# Patient Record
Sex: Male | Born: 1990 | Race: Black or African American | Hispanic: No | Marital: Married | Smoking: Never smoker
Health system: Southern US, Community
[De-identification: ages and names within clinical notes are randomized; demographics above are authoritative.]

## PROBLEM LIST (undated history)

## (undated) DIAGNOSIS — R519 Headache, unspecified: Secondary | ICD-10-CM

## (undated) DIAGNOSIS — T7840XA Allergy, unspecified, initial encounter: Secondary | ICD-10-CM

## (undated) HISTORY — DX: Headache, unspecified: R51.9

## (undated) HISTORY — DX: Allergy, unspecified, initial encounter: T78.40XA

---

## 2015-09-26 ENCOUNTER — Ambulatory Visit (INDEPENDENT_AMBULATORY_CARE_PROVIDER_SITE_OTHER): Payer: BLUE CROSS/BLUE SHIELD

## 2015-09-26 ENCOUNTER — Ambulatory Visit (INDEPENDENT_AMBULATORY_CARE_PROVIDER_SITE_OTHER): Payer: BLUE CROSS/BLUE SHIELD | Admitting: Urgent Care

## 2015-09-26 VITALS — BP 122/82 | HR 74 | Temp 98.0°F | Resp 16 | Ht 66.5 in | Wt 201.0 lb

## 2015-09-26 DIAGNOSIS — M7989 Other specified soft tissue disorders: Secondary | ICD-10-CM | POA: Diagnosis not present

## 2015-09-26 DIAGNOSIS — M79644 Pain in right finger(s): Secondary | ICD-10-CM | POA: Diagnosis not present

## 2015-09-26 LAB — POCT CBC
Granulocyte percent: 73.9 %G (ref 37–80)
HCT, POC: 45 % (ref 43.5–53.7)
HEMOGLOBIN: 15.9 g/dL (ref 14.1–18.1)
LYMPH, POC: 1.7 (ref 0.6–3.4)
MCH, POC: 29.5 pg (ref 27–31.2)
MCHC: 35.3 g/dL (ref 31.8–35.4)
MCV: 83.4 fL (ref 80–97)
MID (cbc): 0.3 (ref 0–0.9)
MPV: 7.9 fL (ref 0–99.8)
PLATELET COUNT, POC: 230 10*3/uL (ref 142–424)
POC Granulocyte: 5.8 (ref 2–6.9)
POC LYMPH PERCENT: 22 %L (ref 10–50)
POC MID %: 4.1 % (ref 0–12)
RBC: 5.4 M/uL (ref 4.69–6.13)
RDW, POC: 13.5 %
WBC: 7.8 10*3/uL (ref 4.6–10.2)

## 2015-09-26 LAB — URIC ACID: URIC ACID, SERUM: 5.3 mg/dL (ref 4.0–7.8)

## 2015-09-26 MED ORDER — PREDNISONE 20 MG PO TABS: ORAL_TABLET | ORAL | Status: DC

## 2015-09-26 NOTE — Progress Notes (Signed)
    MRN: [id number] DOB: 29-Sep-1990  Subjective:   Brandon Suarez is a 25 y.o. male presenting for chief complaint of Hand Problem  Reports 2 day history of right thumb swelling, worsening pain. Pain is constant, achy with intermittent sharp pains, aggravated with movement and use of thumb. Has tried ibuprofen with minimal relief. Has also tried icing without relief. Patient is right handed, works for [employer], uses his hand regularly. Denies trauma, tingling, history of bony arthropathy, history of fractures.   Brandon Suarez currently has no medications in their medication list. Also has No Known Allergies.  Brandon Suarez  has a past medical history of Allergy. Also  has no past surgical history on file.  Objective:   Vitals: BP 122/82 mmHg  Pulse 74  Temp(Src) 98 F (36.7 C) (Oral)  Resp 16  Ht 5' 6.5" (1.689 m)  Wt 201 lb (91.173 kg)  BMI 31.96 kg/m2  SpO2 98%  Physical Exam  Constitutional: He is oriented to person, place, and time. He appears well-developed and well-nourished.  Cardiovascular: Normal rate.   Pulmonary/Chest: Effort normal.  Musculoskeletal:       Right hand: He exhibits decreased range of motion (of right thumb in all ROM), bony tenderness (over 1st MCP and areas depicted) and swelling (over area depicted). He exhibits normal capillary refill, no deformity and no laceration. Normal sensation noted. Decreased strength (secondary to pain) noted.       Hands: Neurological: He is alert and oriented to person, place, and time.  Skin: Skin is warm and dry.   No results found.   Results for orders placed or performed in visit on 09/26/15 (from the past 24 hour(s))  POCT CBC     Status: None   Collection Time: 09/26/15 11:05 AM  Result Value Ref Range   WBC 7.8 4.6 - 10.2 K/uL   Lymph, poc 1.7 0.6 - 3.4   POC LYMPH PERCENT 22.0 10 - 50 %L   MID (cbc) 0.3 0 - 0.9   POC MID % 4.1 0 - 12 %M   POC Granulocyte 5.8 2 - 6.9   Granulocyte percent 73.9 37 - 80  %G   RBC 5.40 4.69 - 6.13 M/uL   Hemoglobin 15.9 14.1 - 18.1 g/dL   HCT, POC 40.945.0 81.143.5 - 53.7 %   MCV 83.4 80 - 97 fL   MCH, POC 29.5 27 - 31.2 pg   MCHC 35.3 31.8 - 35.4 g/dL   RDW, POC 91.413.5 %   Platelet Count, POC 230 142 - 424 K/uL   MPV 7.9 0 - 99.8 fL   Assessment and Plan :   This case was precepted with Dr. Katrinka BlazingSmith.   1. Swelling of right thumb 2. Thumb pain, right - Reviewed in clinic. There is no traumatic history. Will manage as inflammatory process. Recheck in 2 days if no improvement. Consider referral to hand specialist at that point.  Wallis BambergMario Amato [place]ano, PA-C [facility] [phone] 09/26/2015 10:47 AM

## 2015-09-26 NOTE — Patient Instructions (Addendum)
Gout Gout is an inflammatory arthritis caused by a buildup of uric acid crystals in the joints. Uric acid is a chemical that is normally present in the blood. When the level of uric acid in the blood is too high it can form crystals that deposit in your joints and tissues. This causes joint redness, soreness, and swelling (inflammation). Repeat attacks are common. Over time, uric acid crystals can form into masses (tophi) near a joint, destroying bone and causing disfigurement. Gout is treatable and often preventable. CAUSES  The disease begins with elevated levels of uric acid in the blood. Uric acid is produced by your body when it breaks down a naturally found substance called purines. Certain foods you eat, such as meats and fish, contain high amounts of purines. Causes of an elevated uric acid level include:  Being passed down from parent to child (heredity).  Diseases that cause increased uric acid production (such as obesity, psoriasis, and certain cancers).  Excessive alcohol use.  Diet, especially diets rich in meat and seafood.  Medicines, including certain cancer-fighting medicines (chemotherapy), water pills (diuretics), and aspirin.  Chronic kidney disease. The kidneys are no longer able to remove uric acid well.  Problems with metabolism. Conditions strongly associated with gout include:  Obesity.  High blood pressure.  High cholesterol.  Diabetes. Not everyone with elevated uric acid levels gets gout. It is not understood why some people get gout and others do not. Surgery, joint injury, and eating too much of certain foods are some of the factors that can lead to gout attacks. SYMPTOMS   An attack of gout comes on quickly. It causes intense pain with redness, swelling, and warmth in a joint.  Fever can occur.  Often, only one joint is involved. Certain joints are more commonly involved:  Base of the big toe.  Knee.  Ankle.  Wrist.  Finger. Without  treatment, an attack usually goes away in a few days to weeks. Between attacks, you usually will not have symptoms, which is different from many other forms of arthritis. DIAGNOSIS  Your caregiver will suspect gout based on your symptoms and exam. In some cases, tests may be recommended. The tests may include:  Blood tests.  Urine tests.  X-rays.  Joint fluid exam. This exam requires a needle to remove fluid from the joint (arthrocentesis). Using a microscope, gout is confirmed when uric acid crystals are seen in the joint fluid. TREATMENT  There are two phases to gout treatment: treating the sudden onset (acute) attack and preventing attacks (prophylaxis).  Treatment of an Acute Attack.  Medicines are used. These include anti-inflammatory medicines or steroid medicines.  An injection of steroid medicine into the affected joint is sometimes necessary.  The painful joint is rested. Movement can worsen the arthritis.  You may use warm or cold treatments on painful joints, depending which works best for you.  Treatment to Prevent Attacks.  If you suffer from frequent gout attacks, your caregiver may advise preventive medicine. These medicines are started after the acute attack subsides. These medicines either help your kidneys eliminate uric acid from your body or decrease your uric acid production. You may need to stay on these medicines for a very long time.  The early phase of treatment with preventive medicine can be associated with an increase in acute gout attacks. For this reason, during the first few months of treatment, your caregiver may also advise you to take medicines usually used for acute gout treatment. Be sure you   understand your caregiver's directions. Your caregiver may make several adjustments to your medicine dose before these medicines are effective.  Discuss dietary treatment with your caregiver or dietitian. Alcohol and drinks high in sugar and fructose and foods  such as meat, poultry, and seafood can increase uric acid levels. Your caregiver or dietitian can advise you on drinks and foods that should be limited. HOME CARE INSTRUCTIONS   Do not take aspirin to relieve pain. This raises uric acid levels.  Only take over-the-counter or prescription medicines for pain, discomfort, or fever as directed by your caregiver.  Rest the joint as much as possible. When in bed, keep sheets and blankets off painful areas.  Keep the affected joint raised (elevated).  Apply warm or cold treatments to painful joints. Use of warm or cold treatments depends on which works best for you.  Use crutches if the painful joint is in your leg.  Drink enough fluids to keep your urine clear or pale yellow. This helps your body get rid of uric acid. Limit alcohol, sugary drinks, and fructose drinks.  Follow your dietary instructions. Pay careful attention to the amount of protein you eat. Your daily diet should emphasize fruits, vegetables, whole grains, and fat-free or low-fat milk products. Discuss the use of coffee, vitamin C, and cherries with your caregiver or dietitian. These may be helpful in lowering uric acid levels.  Maintain a healthy body weight. SEEK MEDICAL CARE IF:   You develop diarrhea, vomiting, or any side effects from medicines.  You do not feel better in 24 hours, or you are getting worse. SEEK IMMEDIATE MEDICAL CARE IF:   Your joint becomes suddenly more tender, and you have chills or a fever. MAKE SURE YOU:   Understand these instructions.  Will watch your condition.  Will get help right away if you are not doing well or get worse.   This information is not intended to replace advice given to you by your health care provider. Make sure you discuss any questions you have with your health care provider.   Document Released: 04/18/2000 Document Revised: 05/12/2014 Document Reviewed: 12/03/2011 Elsevier Interactive Patient Education 2016  Elsevier Inc.     IF you received an x-ray today, you will receive an invoice from [facility]. Please contact  Radiology at [phone] with questions or concerns regarding your invoice.   IF you received labwork today, you will receive an invoice from [facility]. Please contact [facility] at [phone] with questions or concerns regarding your invoice.   Our billing staff will not be able to assist you with questions regarding bills from these companies.  You will be contacted with the lab results as soon as they are available. The fastest way to get your results is to activate your My Chart account. Instructions are located on the last page of this paperwork. If you have not heard from us regarding the results in 2 weeks, please contact this office.      

## 2015-09-28 ENCOUNTER — Telehealth: Payer: Self-pay

## 2015-09-28 NOTE — Telephone Encounter (Signed)
Noted  

## 2015-09-28 NOTE — Telephone Encounter (Signed)
Pt came in to ask about his lab results.  Angie gave told him his uric acid levels were normal.  Pt says its doing better, but still painful.  If he is not any better he will call or come back in next Tuesday.

## 2015-10-02 ENCOUNTER — Encounter: Payer: Self-pay | Admitting: *Deleted

## 2015-10-02 ENCOUNTER — Encounter: Payer: Self-pay | Admitting: Urgent Care

## 2016-12-02 DIAGNOSIS — J301 Allergic rhinitis due to pollen: Secondary | ICD-10-CM | POA: Insufficient documentation

## 2017-10-02 ENCOUNTER — Ambulatory Visit (INDEPENDENT_AMBULATORY_CARE_PROVIDER_SITE_OTHER): Admitting: Urgent Care

## 2017-10-02 ENCOUNTER — Encounter: Payer: Self-pay | Admitting: Urgent Care

## 2017-10-02 ENCOUNTER — Ambulatory Visit (INDEPENDENT_AMBULATORY_CARE_PROVIDER_SITE_OTHER)

## 2017-10-02 VITALS — BP 132/75 | HR 69 | Temp 98.5°F | Resp 17 | Ht 66.5 in | Wt 207.0 lb

## 2017-10-02 DIAGNOSIS — M545 Low back pain, unspecified: Secondary | ICD-10-CM

## 2017-10-02 DIAGNOSIS — M79671 Pain in right foot: Secondary | ICD-10-CM

## 2017-10-02 DIAGNOSIS — B001 Herpesviral vesicular dermatitis: Secondary | ICD-10-CM | POA: Diagnosis not present

## 2017-10-02 MED ORDER — CYCLOBENZAPRINE HCL 5 MG PO TABS: 5.0000 mg | ORAL_TABLET | Freq: Three times a day (TID) | ORAL | 1 refills | Status: DC | PRN

## 2017-10-02 MED ORDER — PREDNISONE 10 MG PO TABS: 40.0000 mg | ORAL_TABLET | Freq: Every day | ORAL | 0 refills | Status: DC

## 2017-10-02 MED ORDER — ACYCLOVIR 400 MG PO TABS: ORAL_TABLET | ORAL | 5 refills | Status: DC

## 2017-10-02 NOTE — Progress Notes (Addendum)
    MRN: [id number] DOB: 1991-01-22  Subjective:   Carlus PavlovDesmond Norell is a 27 y.o. male presenting for 2 month history of right foot pain over medial/ball of his foot that radiates into his right great medial toe. Patient is in [employer], [place], had to be on his feet a lot and do a lot of walking. Reports that it would occasionally look red. Denies fever, trauma, swelling, warmth. [place], has been using ibuprofen since then. Also used naproxen with short term relief. Also reports low back pain, stiffness. Has difficulty bending down and laying in certain positions. Denies falls, injury, trauma. Has used medications above. Has strenuous work related activities including a planned physical fitness test coming up. Would like a work note.   [name] has a current medication list which includes the following prescription(s): ibuprofen. Also has No Known Allergies.  [name]  has a past medical history of Allergy. Denies past surgical history. Denies family history of cancer, diabetes, HTN, HL, heart disease, stroke, mental illness.   Objective:   Vitals: BP 132/75   Pulse 69   Temp 98.5 F (36.9 C) (Oral)   Resp 17   Ht 5' 6.5" (1.689 m)   Wt 207 lb (93.9 kg)   SpO2 98%   BMI 32.91 kg/m   Physical Exam  Constitutional: He is oriented to person, place, and time. He appears well-developed and well-nourished.  Cardiovascular: Normal rate.  Pulmonary/Chest: Effort normal.  Musculoskeletal:       Lumbar back: He exhibits decreased range of motion (flexion, extension) and tenderness (over area depicted, mild). He exhibits no bony tenderness, no swelling, no edema, no deformity, no laceration, no pain, no spasm and normal pulse.       Back:       Right foot: There is tenderness (mild over areas depicted). There is normal range of motion, no bony tenderness, no swelling, normal capillary refill, no crepitus, no deformity and no  laceration.       Feet:  Negative SLR.  Neurological: He is alert and oriented to person, place, and time. He displays normal reflexes.   Assessment and Plan :   Acute bilateral low back pain without sciatica - Plan: Uric A+ANA+RA Qn+CRP+ASO, DG Lumbar Spine Complete  Right foot pain - Plan: Uric A+ANA+RA Qn+CRP+ASO  Labs pending will use prednisone course given that patient has failed treatment with naproxen and ibuprofen at high doses.  Radiology report pending and labs pending.  Wrote for a work note for [employer] only.  Follow-up if symptoms fail to resolve.  Wallis BambergMario Allan Bacigalupi, PA-C 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  I examined him in room 6, reported that he had cold sore over his left lower lip and is requesting medication for this.  On physical exam patient has clusters of erythematous tender lesions over left lower lip.  I counseled that we could use acyclovir to manage this as herpes culture type of infection.  Sent a prescription to his pharmacy electronically.

## 2017-10-02 NOTE — Addendum Note (Signed)
Addended by: Wallis Bamberg on: 10/02/2017 10:55 AM   Modules accepted: Orders, Level of Service

## 2017-10-02 NOTE — Patient Instructions (Addendum)
Hydrate well with at least 2 liters (1 gallon) of water daily.     Back Pain, Adult Many adults have back pain from time to time. Common causes of back pain include:  A strained muscle or ligament.  Wear and tear (degeneration) of the spinal disks.  Arthritis.  A hit to the back.  Back pain can be short-lived (acute) or last a long time (chronic). A physical exam, lab tests, and imaging studies may be done to find the cause of your pain. Follow these instructions at home: Managing pain and stiffness  Take over-the-counter and prescription medicines only as told by your health care provider.  If directed, apply heat to the affected area as often as told by your health care provider. Use the heat source that your health care provider recommends, such as a moist heat pack or a heating pad. ? Place a towel between your skin and the heat source. ? Leave the heat on for 20-30 minutes. ? Remove the heat if your skin turns bright red. This is especially important if you are unable to feel pain, heat, or cold. You have a greater risk of getting burned.  If directed, apply ice to the injured area: ? Put ice in a plastic bag. ? Place a towel between your skin and the bag. ? Leave the ice on for 20 minutes, 2-3 times a day for the first 2-3 days. Activity  Do not stay in bed. Resting more than 1-2 days can delay your recovery.  Take short walks on even surfaces as soon as you are able. Try to increase the length of time you walk each day.  Do not sit, drive, or stand in one place for more than 30 minutes at a time. Sitting or standing for long periods of time can put stress on your back.  Use proper lifting techniques. When you bend and lift, use positions that put less stress on your back: ? [place] your knees. ? Keep the load close to your body. ? Avoid twisting.  Exercise regularly as told by your health care provider. Exercising will help your back heal faster. This also helps prevent  back injuries by keeping muscles strong and flexible.  Your health care provider may recommend that you see a physical therapist. This person can help you come up with a safe exercise program. Do any exercises as told by your physical therapist. Lifestyle  Maintain a healthy weight. Extra weight puts stress on your back and makes it difficult to have good posture.  Avoid activities or situations that make you feel anxious or stressed. Learn ways to manage anxiety and stress. One way to manage stress is through exercise. Stress and anxiety increase muscle tension and can make back pain worse. General instructions  Sleep on a firm mattress in a comfortable position. Try lying on your side with your knees slightly bent. If you lie on your back, put a pillow under your knees.  Follow your treatment plan as told by your health care provider. This may include: ? Cognitive or behavioral therapy. ? Acupuncture or massage therapy. ? Meditation or yoga. Contact a health care provider if:  You have pain that is not relieved with rest or medicine.  You have increasing pain going down into your legs or buttocks.  Your pain does not improve in 2 weeks.  You have pain at night.  You lose weight.  You have a fever or chills. Get help right away if:  You develop  new bowel or bladder control problems.  You have unusual weakness or numbness in your arms or legs.  You develop nausea or vomiting.  You develop abdominal pain.  You feel faint. Summary  Many adults have back pain from time to time. A physical exam, lab tests, and imaging studies may be done to find the cause of your pain.  Use proper lifting techniques. When you bend and lift, use positions that put less stress on your back.  Take over-the-counter and prescription medicines and apply heat or ice as directed by your health care provider. This information is not intended to replace advice given to you by your health care  provider. Make sure you discuss any questions you have with your health care provider. Document Released: 04/21/2005 Document Revised: 05/26/2016 Document Reviewed: 05/26/2016 Elsevier Interactive Patient Education  2018 [employer]    IF you received an x-ray today, you will receive an invoice from [facility]. Please contact [facility] at [phone] with questions or concerns regarding your invoice.   IF you received labwork today, you will receive an invoice from [facility]. Please contact [facility] at [phone] with questions or concerns regarding your invoice.   Our billing staff will not be able to assist you with questions regarding bills from these companies.  You will be contacted with the lab results as soon as they are available. The fastest way to get your results is to activate your My Chart account. Instructions are located on the last page of this paperwork. If you have not heard from us regarding the results in 2 weeks, please contact this office.

## 2017-10-03 LAB — URIC A+ANA+RA QN+CRP+ASO
ASO: 33 [IU]/mL (ref 0.0–200.0)
Anti Nuclear Antibody(ANA): NEGATIVE
CRP: 1.7 mg/L (ref 0.0–4.9)
URIC ACID: 5.1 mg/dL (ref 3.7–8.6)

## 2017-10-05 ENCOUNTER — Encounter: Payer: Self-pay | Admitting: *Deleted

## 2017-10-06 ENCOUNTER — Ambulatory Visit: Payer: Self-pay | Admitting: Podiatry

## 2017-10-06 ENCOUNTER — Other Ambulatory Visit: Payer: Self-pay

## 2017-12-12 ENCOUNTER — Other Ambulatory Visit: Payer: Self-pay

## 2017-12-12 ENCOUNTER — Encounter: Payer: Self-pay | Admitting: Emergency Medicine

## 2017-12-12 ENCOUNTER — Ambulatory Visit (INDEPENDENT_AMBULATORY_CARE_PROVIDER_SITE_OTHER): Admitting: Emergency Medicine

## 2017-12-12 VITALS — BP 115/67 | HR 75 | Temp 98.6°F | Resp 16 | Ht 65.25 in | Wt 194.2 lb

## 2017-12-12 DIAGNOSIS — M62838 Other muscle spasm: Secondary | ICD-10-CM | POA: Diagnosis not present

## 2017-12-12 DIAGNOSIS — M542 Cervicalgia: Secondary | ICD-10-CM

## 2017-12-12 MED ORDER — DICLOFENAC SODIUM 75 MG PO TBEC: 75.0000 mg | DELAYED_RELEASE_TABLET | Freq: Two times a day (BID) | ORAL | 0 refills | Status: AC

## 2017-12-12 MED ORDER — CYCLOBENZAPRINE HCL 5 MG PO TABS: 5.0000 mg | ORAL_TABLET | Freq: Three times a day (TID) | ORAL | 0 refills | Status: DC | PRN

## 2017-12-12 NOTE — Patient Instructions (Addendum)
   IF you received an x-ray today, you will receive an invoice from [facility]. Please contact [facility] at [phone] with questions or concerns regarding your invoice.   IF you received labwork today, you will receive an invoice from [facility]. Please contact [facility] at [phone] with questions or concerns regarding your invoice.   Our billing staff will not be able to assist you with questions regarding bills from these companies.  You will be contacted with the lab results as soon as they are available. The fastest way to get your results is to activate your My Chart account. Instructions are located on the last page of this paperwork. If you have not heard from us regarding the results in 2 weeks, please contact this office.      Cervical Sprain A cervical sprain is a stretch or tear in the tissues that connect bones (ligaments) in the neck. Most neck (cervical) sprains get better in 4-6 weeks. Follow these instructions at home: If you have a neck collar:  Wear it as told by your doctor. Do not take off (do not remove) the collar unless your doctor says that this is safe.  Ask your doctor before adjusting your collar.  If you have long hair, keep it outside of the collar.  Ask your doctor if you may take off the collar for cleaning and bathing. If you may take off the collar: ? Follow instructions from your doctor about how to take off the collar safely. ? Clean the collar by wiping it with mild soap and water. Let it air-dry all the way. ? If your collar has removable pads:  Take the pads out every 1-2 days.  Hand wash the pads with soap and water.  Let the pads air-dry all the way before you put them back in the collar. Do not dry them in a clothes dryer. Do not dry them with a hair dryer. ? Check your skin under the collar for irritation or sores. If you see any, tell your doctor. Managing pain, stiffness, and swelling  Use a cervical traction  device, if told by your doctor.  If told, put heat on the affected area. Do this before exercises (physical therapy) or as often as told by your doctor. Use the heat source that your doctor recommends, such as a moist heat pack or a heating pad. ? Place a towel between your skin and the heat source. ? Leave the heat on for 20-30 minutes. ? Take the heat off (remove the heat) if your skin turns bright red. This is very important if you cannot feel pain, heat, or cold. You may have a greater risk of getting burned.  Put ice on the affected area. ? Put ice in a plastic bag. ? Place a towel between your skin and the bag. ? Leave the ice on for 20 minutes, 2-3 times a day. Activity  Do not drive while wearing a neck collar. If you do not have a neck collar, ask your doctor if it is safe to drive.  Do not drive or use heavy machinery while taking prescription pain medicine or muscle relaxants, unless your doctor approves.  Do not lift anything that is heavier than 10 lb (4.5 kg) until your doctor tells you that it is safe.  Rest as told by your doctor.  Avoid activities that make you feel worse. Ask your doctor what activities are safe for you.  Do exercises as told by your doctor or physical   therapist. Preventing neck sprain  Practice good posture. Adjust your workstation to help with this, if needed.  Exercise regularly as told by your doctor or physical therapist.  Avoid activities that are risky or may cause a neck sprain (cervical sprain). General instructions  Take over-the-counter and prescription medicines only as told by your doctor.  Do not use any products that contain nicotine or tobacco. This includes cigarettes and e-cigarettes. If you need help quitting, ask your doctor.  Keep all follow-up visits as told by your doctor. This is important. Contact a doctor if:  You have pain or other symptoms that get worse.  You have symptoms that do not get better after 2  weeks.  You have pain that does not get better with medicine.  You start to have new, unexplained symptoms.  You have sores or irritated skin from wearing your neck collar. Get help right away if:  You have very bad pain.  You have any of the following in any part of your body: ? Loss of feeling (numbness). ? Tingling. ? Weakness.  You cannot move a part of your body (you have paralysis).  Your activity level does not improve. Summary  A cervical sprain is a stretch or tear in the tissues that connect bones (ligaments) in the neck.  If you have a neck (cervical) collar, do not take off the collar unless your doctor says that this is safe.  Put ice on affected areas as told by your doctor.  Put heat on affected areas as told by your doctor.  Good posture and regular exercise can help prevent a neck sprain from happening again. This information is not intended to replace advice given to you by your health care provider. Make sure you discuss any questions you have with your health care provider. Document Released: 10/08/2007 Document Revised: 01/01/2016 Document Reviewed: 01/01/2016 Elsevier Interactive Patient Education  2017 Elsevier Inc.  

## 2017-12-12 NOTE — Progress Notes (Signed)
Brandon Suarez 27 y.o.   Chief Complaint  Patient presents with  . Back Pain    per patient come and go  . Neck Pain    x 1 week per patient back of the nec to sides    HISTORY OF PRESENT ILLNESS: This is a 27 y.o. male complaining of neck pain for 1 week.  It will started a week ago with a sore throat, sinus congestion, headaches, and neck pain.  Infectious symptoms gone but neck pain persists.  Denies injury.  No fever or chills.  No other significant symptoms.  Pain is sharp and constant, worse with turning head, and better with rest.  HPI   Prior to Admission medications   Medication Sig Start Date End Date Taking? Authorizing Provider  acyclovir (ZOVIRAX) 400 MG tablet Take 1 tablet 3 times daily for 5 to 10 days for cold sore flare ups. 10/02/17  Yes Wallis Bamberg, PA-C  cetirizine (ZYRTEC) 10 MG tablet Take by mouth.   Yes [provider]  cyclobenzaprine (FLEXERIL) 5 MG tablet Take 1 tablet (5 mg total) by mouth 3 (three) times daily as needed. Patient not taking: Reported on 12/12/2017 10/02/17   Wallis Bamberg, PA-C  meloxicam [facility]) 15 MG tablet Take by mouth. 08/25/17 08/25/18  [provider]  predniSONE (DELTASONE) 10 MG tablet Take 4 tablets (40 mg total) by mouth daily with breakfast. Patient not taking: Reported on 12/12/2017 10/02/17   Wallis Bamberg, PA-C    No Known Allergies  Patient Active Problem List   Diagnosis Date Noted  . Seasonal allergic rhinitis due to pollen 12/02/2016    Past Medical History:  Diagnosis Date  . Allergy     No past surgical history on file.  Social History   Socioeconomic History  . Marital status: Single    Spouse name: Not on file  . Number of children: Not on file  . Years of education: Not on file  . Highest education level: Not on file  Occupational History  . Not on file  Social Needs  . Financial resource strain: Not on file  . Food insecurity:    Worry: Not on file    Inability: Not on file  .  Transportation needs:    Medical: Not on file    Non-medical: Not on file  Tobacco Use  . Smoking status: Never Smoker  . Smokeless tobacco: Never Used  Substance and Sexual Activity  . Alcohol use: No  . Drug use: Not on file  . Sexual activity: Not on file  Lifestyle  . Physical activity:    Days per week: Not on file    Minutes per session: Not on file  . Stress: Not on file  Relationships  . Social connections:    Talks on phone: Not on file    Gets together: Not on file    Attends religious service: Not on file    Active member of club or organization: Not on file    Attends meetings of clubs or organizations: Not on file    Relationship status: Not on file  . Intimate partner violence:    Fear of current or ex partner: Not on file    Emotionally abused: Not on file    Physically abused: Not on file    Forced sexual activity: Not on file  Other Topics Concern  . Not on file  Social History Narrative  . Not on file    No family history on file.  Review of Systems  Constitutional: Negative.  Negative for chills and fever.  HENT: Positive for congestion and sore throat. Negative for nosebleeds.   Eyes: Negative.  Negative for blurred vision, double vision, discharge and redness.  Respiratory: Negative.  Negative for cough, shortness of breath and wheezing.   Cardiovascular: Negative.  Negative for chest pain and palpitations.  Gastrointestinal: Negative.  Negative for abdominal pain, diarrhea, nausea and vomiting.  Genitourinary: Negative.   Musculoskeletal: Positive for neck pain. Negative for back pain and joint pain.  Skin: Negative.  Negative for rash.  Neurological: Positive for headaches. Negative for dizziness, speech change, focal weakness and weakness.  Endo/Heme/Allergies: Negative.    Vitals:   12/12/17 1104  BP: 115/67  Pulse: 75  Resp: 16  Temp: 98.6 F (37 C)  SpO2: 97%     Physical Exam  Constitutional: He is oriented to person, place,  and time. He appears well-developed and well-nourished.  HENT:  Head: Normocephalic and atraumatic.  Right Ear: External ear normal.  Left Ear: External ear normal.  Nose: Nose normal.  Mouth/Throat: Oropharynx is clear and moist.  Eyes: Pupils are equal, round, and reactive to light. Conjunctivae and EOM are normal.  Neck: Muscular tenderness present. No spinous process tenderness present. No neck rigidity. No edema and no erythema present. No Brudzinski's sign and no Kernig's sign noted.  Cardiovascular: Normal rate, regular rhythm and normal heart sounds.  Pulmonary/Chest: Effort normal and breath sounds normal.  Abdominal: Soft. There is no tenderness.  Musculoskeletal: Normal range of motion. He exhibits no edema or tenderness.  Neurological: He is alert and oriented to person, place, and time. No sensory deficit. He exhibits normal muscle tone. Coordination normal.  Skin: Skin is warm and dry. Capillary refill takes less than 2 seconds. No rash noted.  Psychiatric: He has a normal mood and affect. His behavior is normal.  Vitals reviewed.  A total of 25 minutes was spent in the room with the patient, greater than 50% of which was in counseling/coordination of care regarding differential diagnosis, treatment, medications, and need for follow-up if no better or worse.   ASSESSMENT & PLAN: Brandon Suarez was seen today for back pain and neck pain.  Diagnoses and all orders for this visit:  Neck pain, musculoskeletal -     diclofenac (VOLTAREN) 75 MG EC tablet; Take 1 tablet (75 mg total) by mouth 2 (two) times daily for 5 days. After 5 days take as needed.  Muscle spasm -     cyclobenzaprine (FLEXERIL) 5 MG tablet; Take 1 tablet (5 mg total) by mouth 3 (three) times daily as needed.    Patient Instructions       IF you received an x-ray today, you will receive an invoice from [facility]. Please contact [facility] at [phone] with questions or concerns  regarding your invoice.   IF you received labwork today, you will receive an invoice from [facility]. Please contact [facility] at [phone] with questions or concerns regarding your invoice.   Our billing staff will not be able to assist you with questions regarding bills from these companies.  You will be contacted with the lab results as soon as they are available. The fastest way to get your results is to activate your My Chart account. Instructions are located on the last page of this paperwork. If you have not heard from [place] regarding the results in 2 weeks, please contact this office.     Cervical Sprain A cervical sprain is a  stretch or tear in the tissues that connect bones (ligaments) in the neck. Most neck (cervical) sprains get better in 4-6 weeks. Follow these instructions at home: If you have a neck collar:  Wear it as told by your doctor. Do not take off (do not remove) the collar unless your doctor says that this is safe.  Ask your doctor before adjusting your collar.  If you have long hair, keep it outside of the collar.  Ask your doctor if you may take off the collar for cleaning and bathing. If you may take off the collar: ? Follow instructions from your doctor about how to take off the collar safely. ? Clean the collar by wiping it with mild soap and water. Let it air-dry all the way. ? If your collar has removable pads:  Take the pads out every 1-2 days.  Hand wash the pads with soap and water.  Let the pads air-dry all the way before you put them back in the collar. Do not dry them in a clothes dryer. Do not dry them with a hair dryer. ? Check your skin under the collar for irritation or sores. If you see any, tell your doctor. Managing pain, stiffness, and swelling  Use a cervical traction device, if told by your doctor.  If told, put heat on the affected area. Do this before exercises (physical therapy) or as often as told by your doctor. Use the heat source  that your doctor recommends, such as a moist heat pack or a heating pad. ? Place a towel between your skin and the heat source. ? Leave the heat on for 20-30 minutes. ? Take the heat off (remove the heat) if your skin turns bright red. This is very important if you cannot feel pain, heat, or cold. You may have a greater risk of getting burned.  Put ice on the affected area. ? Put ice in a plastic bag. ? Place a towel between your skin and the bag. ? Leave the ice on for 20 minutes, 2-3 times a day. Activity  Do not drive while wearing a neck collar. If you do not have a neck collar, ask your doctor if it is safe to drive.  Do not drive or use heavy machinery while taking prescription pain medicine or muscle relaxants, unless your doctor approves.  Do not lift anything that is heavier than 10 lb (4.5 kg) until your doctor tells you that it is safe.  Rest as told by your doctor.  Avoid activities that make you feel worse. Ask your doctor what activities are safe for you.  Do exercises as told by your doctor or physical therapist. Preventing neck sprain  Practice good posture. Adjust your workstation to help with this, if needed.  Exercise regularly as told by your doctor or physical therapist.  Avoid activities that are risky or may cause a neck sprain (cervical sprain). General instructions  Take over-the-counter and prescription medicines only as told by your doctor.  Do not use any products that contain nicotine or tobacco. This includes cigarettes and e-cigarettes. If you need help quitting, ask your doctor.  Keep all follow-up visits as told by your doctor. This is important. Contact a doctor if:  You have pain or other symptoms that get worse.  You have symptoms that do not get better after 2 weeks.  You have pain that does not get better with medicine.  You start to have new, unexplained symptoms.  You have sores or  irritated skin from wearing your neck collar. Get  help right away if:  You have very bad pain.  You have any of the following in any part of your body: ? Loss of feeling (numbness). ? Tingling. ? Weakness.  You cannot move a part of your body (you have paralysis).  Your activity level does not improve. Summary  A cervical sprain is a stretch or tear in the tissues that connect bones (ligaments) in the neck.  If you have a neck (cervical) collar, do not take off the collar unless your doctor says that this is safe.  Put ice on affected areas as told by your doctor.  Put heat on affected areas as told by your doctor.  Good posture and regular exercise can help prevent a neck sprain from happening again. This information is not intended to replace advice given to you by your health care provider. Make sure you discuss any questions you have with your health care provider. Document Released: 10/08/2007 Document Revised: 01/01/2016 Document Reviewed: 01/01/2016 Elsevier Interactive Patient Education  2017 Elsevier Inc.      Edwina Barth, MD [facility]

## 2018-03-25 ENCOUNTER — Ambulatory Visit (INDEPENDENT_AMBULATORY_CARE_PROVIDER_SITE_OTHER): Admitting: Emergency Medicine

## 2018-03-25 ENCOUNTER — Encounter: Payer: Self-pay | Admitting: Emergency Medicine

## 2018-03-25 ENCOUNTER — Other Ambulatory Visit: Payer: Self-pay

## 2018-03-25 VITALS — BP 119/71 | HR 71 | Temp 99.0°F | Resp 16 | Ht 65.5 in | Wt 198.0 lb

## 2018-03-25 DIAGNOSIS — M79672 Pain in left foot: Secondary | ICD-10-CM

## 2018-03-25 DIAGNOSIS — G8929 Other chronic pain: Secondary | ICD-10-CM | POA: Insufficient documentation

## 2018-03-25 DIAGNOSIS — M79671 Pain in right foot: Secondary | ICD-10-CM | POA: Diagnosis not present

## 2018-03-25 DIAGNOSIS — B353 Tinea pedis: Secondary | ICD-10-CM | POA: Diagnosis not present

## 2018-03-25 MED ORDER — CLOTRIMAZOLE-BETAMETHASONE 1-0.05 % EX CREA: 1.0000 "application " | TOPICAL_CREAM | Freq: Two times a day (BID) | CUTANEOUS | 3 refills | Status: DC

## 2018-03-25 NOTE — Patient Instructions (Addendum)
     If you have lab work done today you will be contacted with your lab results within the next 2 weeks.  If you have not heard from us then please contact us. The fastest way to get your results is to register for My Chart.   IF you received an x-ray today, you will receive an invoice from [facility]. Please contact [facility] at [phone] with questions or concerns regarding your invoice.   IF you received labwork today, you will receive an invoice from [facility]. Please contact [facility] at [phone] with questions or concerns regarding your invoice.   Our billing staff will not be able to assist you with questions regarding bills from these companies.  You will be contacted with the lab results as soon as they are available. The fastest way to get your results is to activate your My Chart account. Instructions are located on the last page of this paperwork. If you have not heard from us regarding the results in 2 weeks, please contact this office.     Athlete's Foot Athlete's foot (tinea pedis) is a fungal infection of the skin on the feet. It often occurs on the skin that is between or underneath the toes. It can also occur on the soles of the feet. The infection can spread from person to person (is contagious). Follow these instructions at home:  Apply or take over-the-counter and prescription medicines only as told by your doctor.  Keep all follow-up visits as told by your doctor. This is important.  Do not scratch your feet.  Keep your feet dry: ? Wear cotton or wool socks. Change your socks every day or if they become wet. ? Wear shoes that allow air to move around, such as sandals or canvas tennis shoes.  Wash and dry your feet: ? Every day or as told by your doctor. ? After exercising. ? Including the area between your toes.  Wear sandals in wet areas, such as locker rooms and shared showers.  Do not share any of these items: ? Towels. ? Nail  clippers. ? Other personal items that touch your feet.  If you have diabetes, keep your blood sugar under control. Contact a doctor if:  You have a fever.  You have swelling, soreness, warmth, or redness in your foot.  You are not getting better with treatment.  Your symptoms get worse.  You have new symptoms. This information is not intended to replace advice given to you by your health care provider. Make sure you discuss any questions you have with your health care provider. Document Released: 10/08/2007 Document Revised: 09/27/2015 Document Reviewed: 10/23/2014 Elsevier Interactive Patient Education  2018 [employer]

## 2018-03-25 NOTE — Progress Notes (Addendum)
Brandon Suarez 27 y.o.   Chief Complaint  Patient presents with  . Foot Pain    per patient both more so the right x on/off 2 months and peeling    HISTORY OF PRESENT ILLNESS: This is a 27 y.o. male complaining of pain to both feet for the past 2 months.  Denies injury.  Right foot worse.  No other significant symptoms  HPI   Prior to Admission medications   Medication Sig Start Date End Date Taking? Authorizing Provider  acyclovir (ZOVIRAX) 400 MG tablet Take 1 tablet 3 times daily for 5 to 10 days for cold sore flare ups. 10/02/17  Yes Wallis Bamberg, PA-C  cetirizine (ZYRTEC) 10 MG tablet Take by mouth.   Yes [provider]  cyclobenzaprine (FLEXERIL) 5 MG tablet Take 1 tablet (5 mg total) by mouth 3 (three) times daily as needed. Patient not taking: Reported on 03/25/2018 12/12/17   Georgina Quint, MD  meloxicam [facility]) 15 MG tablet Take by mouth. 08/25/17 08/25/18  [provider]  predniSONE (DELTASONE) 10 MG tablet Take 4 tablets (40 mg total) by mouth daily with breakfast. Patient not taking: Reported on 12/12/2017 10/02/17   Wallis Bamberg, PA-C    No Known Allergies  Patient Active Problem List   Diagnosis Date Noted  . Seasonal allergic rhinitis due to pollen 12/02/2016    Past Medical History:  Diagnosis Date  . Allergy     No past surgical history on file.  Social History   Socioeconomic History  . Marital status: Single    Spouse name: Not on file  . Number of children: Not on file  . Years of education: Not on file  . Highest education level: Not on file  Occupational History  . Not on file  Social Needs  . Financial resource strain: Not on file  . Food insecurity:    Worry: Not on file    Inability: Not on file  . Transportation needs:    Medical: Not on file    Non-medical: Not on file  Tobacco Use  . Smoking status: Never Smoker  . Smokeless tobacco: Never Used  Substance and Sexual Activity  . Alcohol use: No  . Drug  use: Not on file  . Sexual activity: Not on file  Lifestyle  . Physical activity:    Days per week: Not on file    Minutes per session: Not on file  . Stress: Not on file  Relationships  . Social connections:    Talks on phone: Not on file    Gets together: Not on file    Attends religious service: Not on file    Active member of club or organization: Not on file    Attends meetings of clubs or organizations: Not on file    Relationship status: Not on file  . Intimate partner violence:    Fear of current or ex partner: Not on file    Emotionally abused: Not on file    Physically abused: Not on file    Forced sexual activity: Not on file  Other Topics Concern  . Not on file  Social History Narrative  . Not on file    No family history on file.   Review of Systems  Constitutional: Negative.  Negative for chills and fever.  HENT: Negative.   Eyes: Negative.   Respiratory: Negative.  Negative for cough and shortness of breath.   Cardiovascular: Negative.  Negative for chest pain and palpitations.  Gastrointestinal: Negative.  Negative for abdominal pain, nausea and vomiting.  Genitourinary: Negative.   Musculoskeletal: Negative.   Skin: Negative.   Neurological: Negative.  Negative for dizziness and headaches.  Endo/Heme/Allergies: Negative.   All other systems reviewed and are negative.   Vitals:   03/25/18 0820  BP: 119/71  Pulse: 71  Resp: 16  Temp: 99 F (37.2 C)  SpO2: 99%    Physical Exam  Constitutional: He is oriented to person, place, and time. He appears well-developed and well-nourished.  HENT:  Head: Normocephalic and atraumatic.  Eyes: Pupils are equal, round, and reactive to light. EOM are normal.  Neck: Normal range of motion. Neck supple.  Cardiovascular: Normal rate and regular rhythm.  Pulmonary/Chest: Effort normal and breath sounds normal.  Musculoskeletal:  Feet: Right: Positive athletes foot third and fourth web spaces.  No erythema or  bruising.NVI.  Full range of motion.  Good capillary refill.  No swelling.  No significant tenderness.  Left: Within normal limits.  Neurological: He is alert and oriented to person, place, and time.  Skin: Skin is warm and dry.  Psychiatric: He has a normal mood and affect. His behavior is normal.  Vitals reviewed.  A total of 20 minutes was spent in the room with the patient, greater than 50% of which was in counseling/coordination of care regarding differential diagnosis, treatment, medications, need to follow-up with podiatry.   ASSESSMENT & PLAN: Brandon Suarez was seen today for foot pain.  Diagnoses and all orders for this visit:  Chronic pain of both feet -     Ambulatory referral to Podiatry  Athlete's foot on right -     clotrimazole-betamethasone (LOTRISONE) cream; Apply 1 application topically 2 (two) times daily.    Patient Instructions       If you have lab work done today you will be contacted with your lab results within the next 2 weeks.  If you have not heard from us then please contact us. The fastest way to get your results is to register for My Chart.   IF you received an x-ray today, you will receive an invoice from [facility]. Please contact [facility] at [phone] with questions or concerns regarding your invoice.   IF you received labwork today, you will receive an invoice from [facility]. Please contact [facility] at [phone] with questions or concerns regarding your invoice.   Our billing staff will not be able to assist you with questions regarding bills from these companies.  You will be contacted with the lab results as soon as they are available. The fastest way to get your results is to activate your My Chart account. Instructions are located on the last page of this paperwork. If you have not heard from us regarding the results in 2 weeks, please contact this office.     Athlete's Foot Athlete's foot (tinea pedis) is a fungal  infection of the skin on the feet. It often occurs on the skin that is between or underneath the toes. It can also occur on the soles of the feet. The infection can spread from person to person (is contagious). Follow these instructions at home:  Apply or take over-the-counter and prescription medicines only as told by your doctor.  Keep all follow-up visits as told by your doctor. This is important.  Do not scratch your feet.  Keep your feet dry: ? Wear cotton or wool socks. Change your socks every day or if they become wet. ? Wear shoes that allow  air to move around, such as sandals or canvas tennis shoes.  Wash and dry your feet: ? Every day or as told by your doctor. ? After exercising. ? Including the area between your toes.  Wear sandals in wet areas, such as locker rooms and shared showers.  Do not share any of these items: ? Towels. ? Nail clippers. ? Other personal items that touch your feet.  If you have diabetes, keep your blood sugar under control. Contact a doctor if:  You have a fever.  You have swelling, soreness, warmth, or redness in your foot.  You are not getting better with treatment.  Your symptoms get worse.  You have new symptoms. This information is not intended to replace advice given to you by your health care provider. Make sure you discuss any questions you have with your health care provider. Document Released: 10/08/2007 Document Revised: 09/27/2015 Document Reviewed: 10/23/2014 Elsevier Interactive Patient Education  2018 Elsevier Inc.      Edwina Barth, MD [facility]

## 2018-04-07 ENCOUNTER — Encounter: Payer: Self-pay | Admitting: Emergency Medicine

## 2018-04-07 ENCOUNTER — Other Ambulatory Visit: Payer: Self-pay

## 2018-04-07 ENCOUNTER — Ambulatory Visit (INDEPENDENT_AMBULATORY_CARE_PROVIDER_SITE_OTHER): Admitting: Emergency Medicine

## 2018-04-07 VITALS — BP 113/71 | HR 82 | Temp 98.5°F | Resp 16 | Wt 202.0 lb

## 2018-04-07 DIAGNOSIS — S39012A Strain of muscle, fascia and tendon of lower back, initial encounter: Secondary | ICD-10-CM | POA: Diagnosis not present

## 2018-04-07 DIAGNOSIS — B353 Tinea pedis: Secondary | ICD-10-CM

## 2018-04-07 DIAGNOSIS — M7918 Myalgia, other site: Secondary | ICD-10-CM

## 2018-04-07 DIAGNOSIS — M62838 Other muscle spasm: Secondary | ICD-10-CM | POA: Diagnosis not present

## 2018-04-07 MED ORDER — DICLOFENAC SODIUM 75 MG PO TBEC: 75.0000 mg | DELAYED_RELEASE_TABLET | Freq: Two times a day (BID) | ORAL | 0 refills | Status: AC

## 2018-04-07 MED ORDER — MICONAZOLE NITRATE 2 % EX AERP: 1.0000 "application " | INHALATION_SPRAY | Freq: Two times a day (BID) | CUTANEOUS | 3 refills | Status: AC

## 2018-04-07 MED ORDER — CYCLOBENZAPRINE HCL 10 MG PO TABS: 10.0000 mg | ORAL_TABLET | Freq: Every day | ORAL | 0 refills | Status: DC

## 2018-04-07 NOTE — Progress Notes (Signed)
Brandon Suarez 27 y.o.   Chief Complaint  Patient presents with  . Back Pain    lower x 2 days - per patient hard to bend over     HISTORY OF PRESENT ILLNESS: This is a 27 y.o. male complaining of low back pain for the past 2 days.  Back Pain  This is a new problem. The current episode started in the past 7 days. The problem occurs constantly. The problem has been gradually worsening since onset. The pain is present in the lumbar spine. The quality of the pain is described as aching. The pain does not radiate. The pain is at a severity of 5/10. The pain is moderate. The pain is the same all the time. The symptoms are aggravated by bending, lying down, standing, position and twisting. Pertinent negatives include no abdominal pain, bladder incontinence, bowel incontinence, chest pain, dysuria, fever, headaches, leg pain, numbness, paresis, paresthesias, pelvic pain, perianal numbness, tingling or weakness. He has tried nothing for the symptoms.     Prior to Admission medications   Medication Sig Start Date End Date Taking? Authorizing Provider  acyclovir (ZOVIRAX) 400 MG tablet Take 1 tablet 3 times daily for 5 to 10 days for cold sore flare ups. 10/02/17  Yes Wallis Bamberg, PA-C  cetirizine (ZYRTEC) 10 MG tablet Take by mouth.   Yes [provider]  clotrimazole-betamethasone (LOTRISONE) cream Apply 1 application topically 2 (two) times daily. 03/25/18   Georgina Quint, MD  cyclobenzaprine (FLEXERIL) 5 MG tablet Take 1 tablet (5 mg total) by mouth 3 (three) times daily as needed. Patient not taking: Reported on 04/07/2018 12/12/17   Georgina Quint, MD  meloxicam [facility]) 15 MG tablet Take by mouth. 08/25/17 08/25/18  [provider]  predniSONE (DELTASONE) 10 MG tablet Take 4 tablets (40 mg total) by mouth daily with breakfast. Patient not taking: Reported on 04/07/2018 10/02/17   Wallis Bamberg, PA-C    No Known Allergies  Patient Active Problem List   Diagnosis  Date Noted  . Chronic pain of both feet 03/25/2018  . Athlete's foot on right 03/25/2018  . Seasonal allergic rhinitis due to pollen 12/02/2016    Past Medical History:  Diagnosis Date  . Allergy     No past surgical history on file.  Social History   Socioeconomic History  . Marital status: Single    Spouse name: Not on file  . Number of children: Not on file  . Years of education: Not on file  . Highest education level: Not on file  Occupational History  . Not on file  Social Needs  . Financial resource strain: Not on file  . Food insecurity:    Worry: Not on file    Inability: Not on file  . Transportation needs:    Medical: Not on file    Non-medical: Not on file  Tobacco Use  . Smoking status: Never Smoker  . Smokeless tobacco: Never Used  Substance and Sexual Activity  . Alcohol use: No  . Drug use: Not on file  . Sexual activity: Not on file  Lifestyle  . Physical activity:    Days per week: Not on file    Minutes per session: Not on file  . Stress: Not on file  Relationships  . Social connections:    Talks on phone: Not on file    Gets together: Not on file    Attends religious service: Not on file    Active member of club  or organization: Not on file    Attends meetings of clubs or organizations: Not on file    Relationship status: Not on file  . Intimate partner violence:    Fear of current or ex partner: Not on file    Emotionally abused: Not on file    Physically abused: Not on file    Forced sexual activity: Not on file  Other Topics Concern  . Not on file  Social History Narrative  . Not on file    No family history on file.   Review of Systems  Constitutional: Negative.  Negative for fever.  HENT: Negative.   Eyes: Negative.   Respiratory: Negative.  Negative for cough and shortness of breath.   Cardiovascular: Negative for chest pain and palpitations.  Gastrointestinal: Negative.  Negative for abdominal pain, blood in stool, bowel  incontinence, diarrhea, melena, nausea and vomiting.  Genitourinary: Negative.  Negative for bladder incontinence, dysuria, flank pain, hematuria and pelvic pain.  Musculoskeletal: Positive for back pain.  Skin: Negative.  Negative for rash.  Neurological: Negative.  Negative for tingling, sensory change, weakness, numbness, headaches and paresthesias.  Endo/Heme/Allergies: Negative.   All other systems reviewed and are negative.   Vitals:   04/07/18 1650  BP: 113/71  Pulse: 82  Resp: 16  Temp: 98.5 F (36.9 C)  SpO2: 95%    Physical Exam  Constitutional: He is oriented to person, place, and time. He appears well-developed and well-nourished.  HENT:  Head: Normocephalic and atraumatic.  Nose: Nose normal.  Mouth/Throat: Oropharynx is clear and moist.  Eyes: Pupils are equal, round, and reactive to light. Conjunctivae and EOM are normal.  Neck: Normal range of motion. Neck supple.  Cardiovascular: Normal rate and regular rhythm.  Pulmonary/Chest: Effort normal and breath sounds normal.  Abdominal: Soft. Bowel sounds are normal. He exhibits no distension. There is no tenderness.  Musculoskeletal:       Lumbar back: He exhibits decreased range of motion, tenderness and spasm. He exhibits no bony tenderness and normal pulse.  Neurological: He is alert and oriented to person, place, and time. No sensory deficit. He exhibits normal muscle tone.  Skin: Skin is warm and dry. Capillary refill takes less than 2 seconds.  Psychiatric: He has a normal mood and affect. His behavior is normal.  Vitals reviewed.   A total of 25 minutes was spent in the room with the patient, greater than 50% of which was in counseling/coordination of care regarding differential diagnosis, treatment, medications, and need for follow-up if no better or worse.  ASSESSMENT & PLAN: Brandon Suarez was seen today for back pain.  Diagnoses and all orders for this visit:  Acute myofascial strain of lumbar region,  initial encounter  Muscle spasm -     cyclobenzaprine (FLEXERIL) 10 MG tablet; Take 1 tablet (10 mg total) by mouth at bedtime.  Musculoskeletal pain -     diclofenac (VOLTAREN) 75 MG EC tablet; Take 1 tablet (75 mg total) by mouth 2 (two) times daily for 5 days. After 5 days take as needed.  Athlete's foot on right -     Miconazole Nitrate (LOTRIMIN AF POWDER) 2 % AERP; Apply 1 application topically 2 (two) times daily for 10 days.    Patient Instructions       If you have lab work done today you will be contacted with your lab results within the next 2 weeks.  If you have not heard from us then please contact us. The fastest way  to get your results is to register for My Chart.   IF you received an x-ray today, you will receive an invoice from [facility]. Please contact [facility] at [phone] with questions or concerns regarding your invoice.   IF you received labwork today, you will receive an invoice from [facility]. Please contact [facility] at [phone] with questions or concerns regarding your invoice.   Our billing staff will not be able to assist you with questions regarding bills from these companies.  You will be contacted with the lab results as soon as they are available. The fastest way to get your results is to activate your My Chart account. Instructions are located on the last page of this paperwork. If you have not heard from [place] regarding the results in 2 weeks, please contact this office.     Back Pain, Adult Back pain is very common. The pain often gets better over time. The cause of back pain is usually not dangerous. Most people can learn to manage their back pain on their own. Follow these instructions at home: Watch your back pain for any changes. The following actions may help to lessen any pain you are feeling:  Stay active. Start with short walks on flat ground if you can. Try to walk farther each day.  Exercise regularly as told  by your doctor. Exercise helps your back heal faster. It also helps avoid future injury by keeping your muscles strong and flexible.  Do not sit, drive, or stand in one place for more than 30 minutes.  Do not stay in bed. Resting more than 1-2 days can slow down your recovery.  Be careful when you bend or lift an object. Use good form when lifting: ? Bend at your knees. ? Keep the object close to your body. ? Do not twist.  Sleep on a firm mattress. Lie on your side, and bend your knees. If you lie on your back, put a pillow under your knees.  Take medicines only as told by your doctor.  Put ice on the injured area. ? Put ice in a plastic bag. ? Place a towel between your skin and the bag. ? Leave the ice on for 20 minutes, 2-3 times a day for the first 2-3 days. After that, you can switch between ice and heat packs.  Avoid feeling anxious or stressed. Find good ways to deal with stress, such as exercise.  Maintain a healthy weight. Extra weight puts stress on your back.  Contact a doctor if:  You have pain that does not go away with rest or medicine.  You have worsening pain that goes down into your legs or buttocks.  You have pain that does not get better in one week.  You have pain at night.  You lose weight.  You have a fever or chills. Get help right away if:  You cannot control when you poop (bowel movement) or pee (urinate).  Your arms or legs feel weak.  Your arms or legs lose feeling (numbness).  You feel sick to your stomach (nauseous) or throw up (vomit).  You have belly (abdominal) pain.  You feel like you may pass out (faint). This information is not intended to replace advice given to you by your health care provider. Make sure you discuss any questions you have with your health care provider. Document Released: 10/08/2007 Document Revised: 09/27/2015 Document Reviewed: 08/23/2013 Elsevier Interactive Patient Education  2018 [employer].      [place]  Mitchel Honour, MD [facility]

## 2018-04-07 NOTE — Patient Instructions (Addendum)
   If you have lab work done today you will be contacted with your lab results within the next 2 weeks.  If you have not heard from us then please contact us. The fastest way to get your results is to register for My Chart.   IF you received an x-ray today, you will receive an invoice from [facility]. Please contact [facility] at [phone] with questions or concerns regarding your invoice.   IF you received labwork today, you will receive an invoice from [facility]. Please contact [facility] at [phone] with questions or concerns regarding your invoice.   Our billing staff will not be able to assist you with questions regarding bills from these companies.  You will be contacted with the lab results as soon as they are available. The fastest way to get your results is to activate your My Chart account. Instructions are located on the last page of this paperwork. If you have not heard from us regarding the results in 2 weeks, please contact this office.     Back Pain, Adult Back pain is very common. The pain often gets better over time. The cause of back pain is usually not dangerous. Most people can learn to manage their back pain on their own. Follow these instructions at home: Watch your back pain for any changes. The following actions may help to lessen any pain you are feeling:  Stay active. Start with short walks on flat ground if you can. Try to walk farther each day.  Exercise regularly as told by your doctor. Exercise helps your back heal faster. It also helps avoid future injury by keeping your muscles strong and flexible.  Do not sit, drive, or stand in one place for more than 30 minutes.  Do not stay in bed. Resting more than 1-2 days can slow down your recovery.  Be careful when you bend or lift an object. Use good form when lifting: ? Bend at your knees. ? Keep the object close to your body. ? Do not twist.  Sleep on a firm mattress. Lie on your  side, and bend your knees. If you lie on your back, put a pillow under your knees.  Take medicines only as told by your doctor.  Put ice on the injured area. ? Put ice in a plastic bag. ? Place a towel between your skin and the bag. ? Leave the ice on for 20 minutes, 2-3 times a day for the first 2-3 days. After that, you can switch between ice and heat packs.  Avoid feeling anxious or stressed. Find good ways to deal with stress, such as exercise.  Maintain a healthy weight. Extra weight puts stress on your back.  Contact a doctor if:  You have pain that does not go away with rest or medicine.  You have worsening pain that goes down into your legs or buttocks.  You have pain that does not get better in one week.  You have pain at night.  You lose weight.  You have a fever or chills. Get help right away if:  You cannot control when you poop (bowel movement) or pee (urinate).  Your arms or legs feel weak.  Your arms or legs lose feeling (numbness).  You feel sick to your stomach (nauseous) or throw up (vomit).  You have belly (abdominal) pain.  You feel like you may pass out (faint). This information is not intended to replace advice given to you by your health care   provider. Make sure you discuss any questions you have with your health care provider. Document Released: 10/08/2007 Document Revised: 09/27/2015 Document Reviewed: 08/23/2013 Elsevier Interactive Patient Education  2018 Elsevier Inc.  

## 2018-04-09 ENCOUNTER — Telehealth: Payer: Self-pay | Admitting: Emergency Medicine

## 2018-04-09 ENCOUNTER — Telehealth: Payer: Self-pay | Admitting: *Deleted

## 2018-04-09 NOTE — Telephone Encounter (Signed)
Copied from CRM [id number]. Topic: Quick Communication - See Telephone Encounter >> Apr 09, 2018  5:24 PM Lorrine KinMcGee, Albeiro Trompeter B, NT wrote: CRM for notification. See Telephone encounter for: 04/09/18. Patient calling and states that he was seen on Wednesday and mentioned to Dr Alvy BimlerSagardia that he has an agility or physical fitness test this weekend and may need a note to excuse him from this. Patient calling and is needing this note. Advised that this is not possible to get done tonight due to him calling at 5:20pm. Spoke to BayviewNicholas at the office and made him aware of patient's request. Advised patient that I would send a message high priority and see if it could be written tomorrow and him pick it up tomorrow.  CB#: [phone]

## 2018-04-09 NOTE — Telephone Encounter (Signed)
Patient was called by Weston BrassNick to pick up note he needed not be participate in the physical fitness test on 04/10/2018. Patient picked up the note on 04/09/2018 evening.

## 2018-12-26 IMAGING — DX DG LUMBAR SPINE COMPLETE 4+V
5 series · 5 of 5 positions shown · non-contrast
Comparison: None.

CLINICAL DATA: Persistent lumbago

EXAM:
LUMBAR SPINE - COMPLETE 4+ VIEW

[l-spine ap]
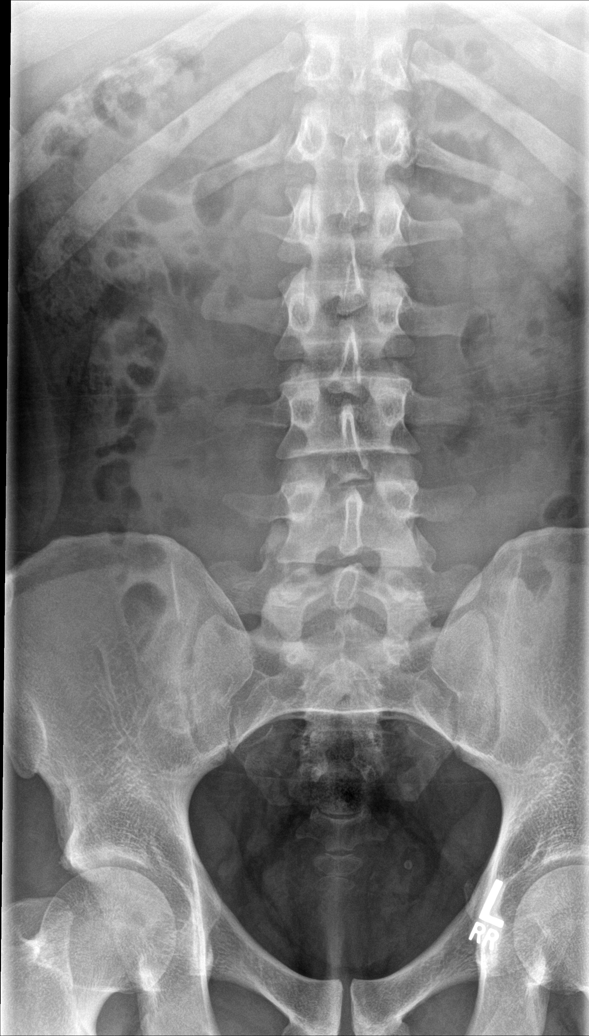

[l-spine obl (1 of 2)]
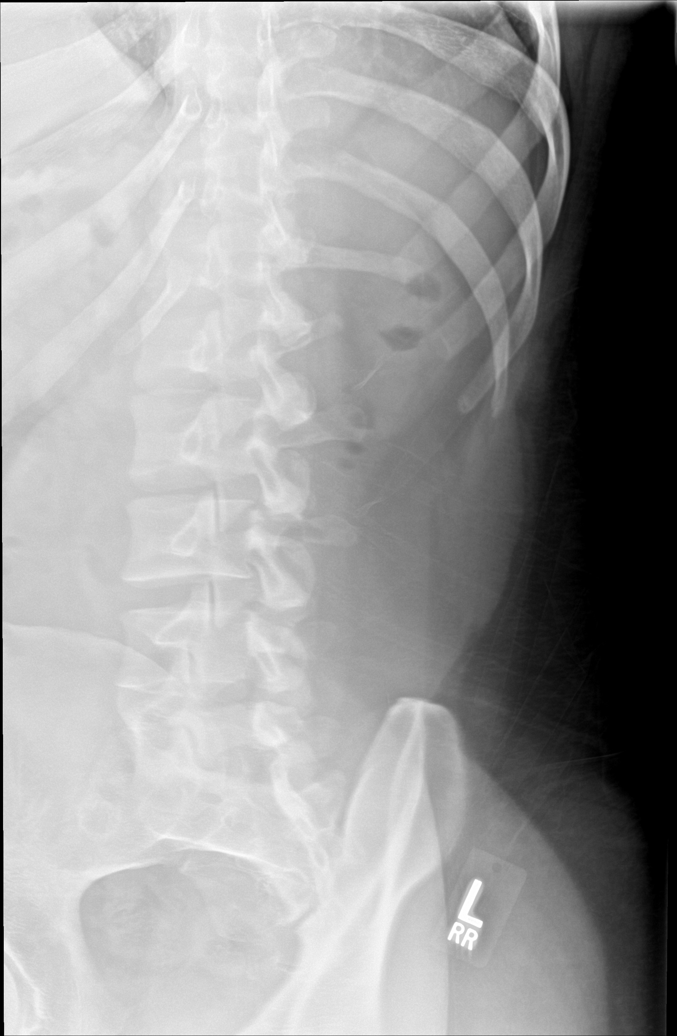

[l-spine obl (2 of 2)]
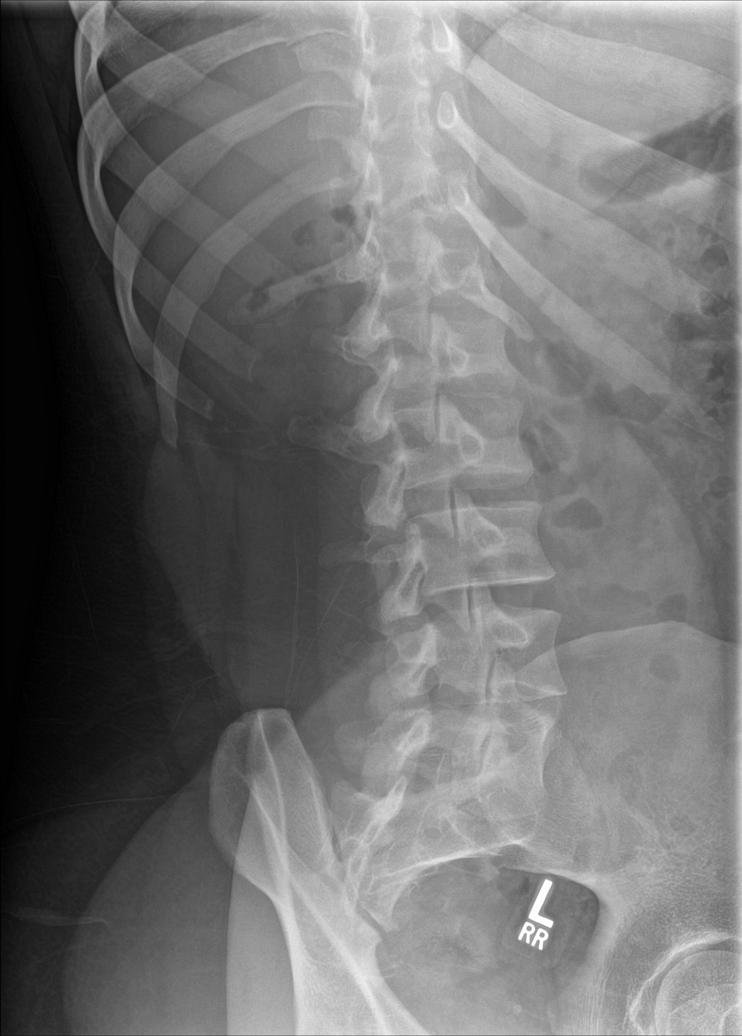

[l-spine lat]
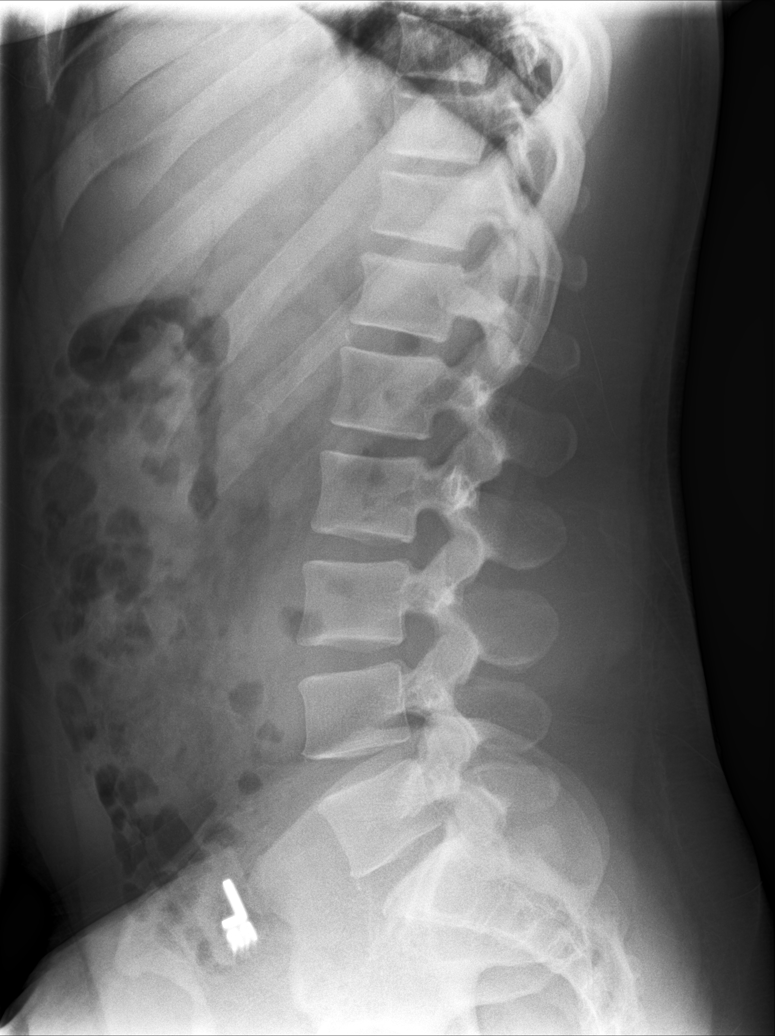

[l-spine l5-s1]
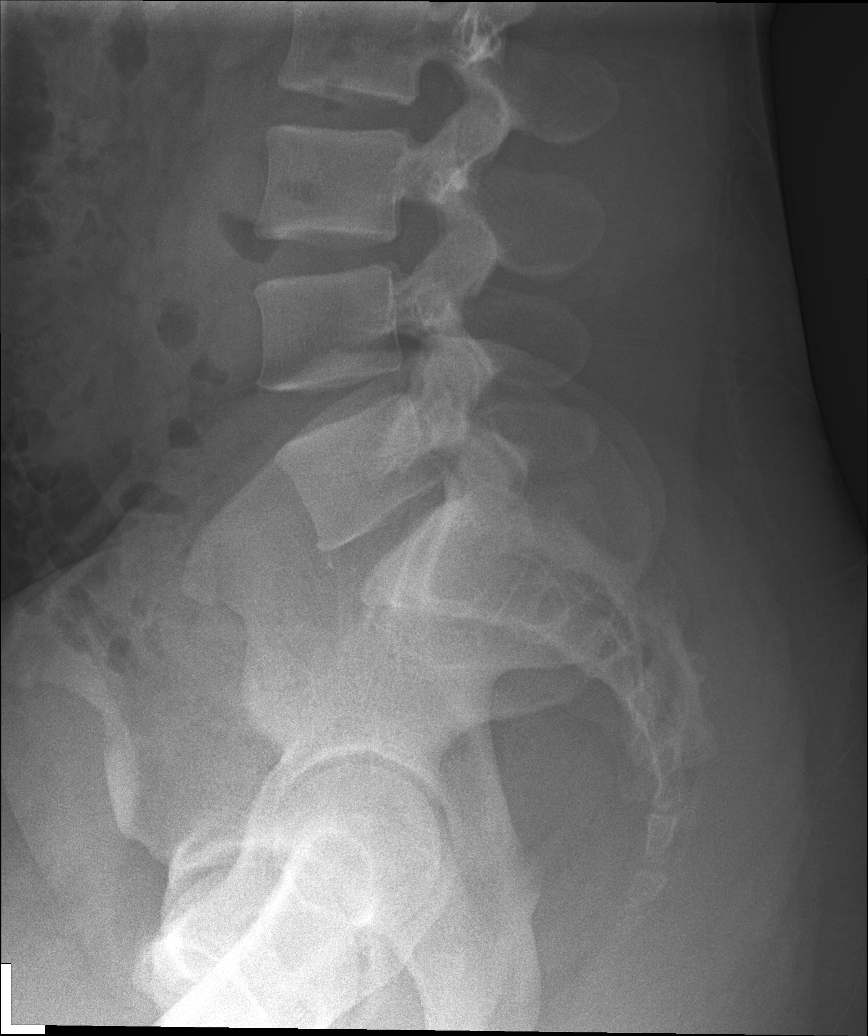

[5 of 5 positions shown; findings below may reference images not displayed]

FINDINGS: Frontal, lateral, spot lumbosacral lateral, and bilateral oblique
views were obtained. There are 5 non-rib-bearing lumbar type
vertebral bodies. There is no fracture or spondylolisthesis. The
disc spaces appear normal. There is no appreciable facet
arthropathy.
IMPRESSION: No fracture or spondylolisthesis.  No appreciable arthropathy.

## 2019-08-15 ENCOUNTER — Encounter: Payer: Self-pay | Admitting: Pediatrics

## 2019-08-15 ENCOUNTER — Other Ambulatory Visit: Payer: Self-pay

## 2019-08-15 ENCOUNTER — Ambulatory Visit (INDEPENDENT_AMBULATORY_CARE_PROVIDER_SITE_OTHER): Admitting: Pediatrics

## 2019-08-15 VITALS — BP 114/80 | HR 71 | Temp 96.5°F | Resp 17 | Ht 65.4 in | Wt 199.4 lb

## 2019-08-15 DIAGNOSIS — J301 Allergic rhinitis due to pollen: Secondary | ICD-10-CM | POA: Diagnosis not present

## 2019-08-15 DIAGNOSIS — H1010 Acute atopic conjunctivitis, unspecified eye: Secondary | ICD-10-CM | POA: Diagnosis not present

## 2019-08-15 MED ORDER — FLUTICASONE PROPIONATE 50 MCG/ACT NA SUSP: 2.0000 | Freq: Every day | NASAL | 5 refills | Status: DC | PRN

## 2019-08-15 MED ORDER — MONTELUKAST SODIUM 10 MG PO TABS: 10.0000 mg | ORAL_TABLET | Freq: Every day | ORAL | 5 refills | Status: DC

## 2019-08-15 MED ORDER — EPINEPHRINE 0.3 MG/0.3ML IJ SOAJ: 0.3000 mg | INTRAMUSCULAR | 1 refills | Status: DC | PRN

## 2019-08-15 NOTE — Patient Instructions (Addendum)
Environmental control of dust, mite and mold Zyrtec 10 mg-take 1 tablet once a day if needed for runny nose or itchy eyes Fluticasone 2 sprays per nostril once a day if needed for stuffy nose Opcon-A-1 drop 3 times a day if needed for itchy eyes Montelukast 10 mg-take 1 tablet once a day to help with your allergic symptoms Prednisone 10 mg tablet-take 2 tablets twice a day for 3 days, 2 tablets on the fourth day, 1 tablet on the fifth day to bring your allergic symptoms under control Follow-up in 1 month but call us if you are not doing well on this treatment plan  Due to the severity of your symptoms, you should be started with allergy injections to grass and trees in 1 vial and weeds in the other vial.  It would be best to start you on allergy injections in early June after the spring allergy season  Call us if you are not doing well on this treatment plan

## 2019-08-15 NOTE — Progress Notes (Signed)
[address] [place] [address] Dept: [phone]  New Patient Note  Patient ID: Brandon Suarez, male    DOB: [birth year]  Age: 29 y.o. MRN: [id number] Date of Office Visit: 08/15/2019 Referring provider: No referring provider defined for this encounter.    Chief Complaint: Allergic Rhinitis  (sneezing, drainage, itchy nose,throat, and eyes. )  HPI Brandon Suarez presents for an allergy evaluation.  He has had a runny nose, stuffy nose, and itchy nose and itchy eyes and sneezing for several years.  His symptoms began in childhood.  He has aggravation of his symptoms on exposure to dust and spring  pollens.  He had allergy testing about 3 years ago and took allergy injections for 1 year.  His last allergy injection was in 2019.Marland Kitchen  He felt he did somewhat better on the allergy injections.  He does snore at night.  At times he has sinus headaches.  He has not had asthmatic symptoms or eczema.  He has not had pneumonia or sinus infections.  He has used different antihistamines and nasal steroids without dramatic improvement  Review of Systems  Constitutional: Negative.   HENT:       Allergic rhinitis since childhood  Eyes:       Itchy eyes at times  Respiratory: Negative.   Cardiovascular: Negative.   Gastrointestinal: Negative.   Genitourinary: Negative.   Musculoskeletal: Negative.   Skin: Negative.   Neurological: Negative.   Endo/Heme/Allergies:       No diabetes or thyroid disease  Psychiatric/Behavioral: Negative.     Outpatient Encounter Medications as of 08/15/2019  Medication Sig  . cetirizine (ZYRTEC) 10 MG tablet Take by mouth.  . clotrimazole-betamethasone (LOTRISONE) cream Apply 1 application topically 2 (two) times daily.  Marland Kitchen acyclovir (ZOVIRAX) 400 MG tablet Take 1 tablet 3 times daily for 5 to 10 days for cold sore flare ups. (Patient not taking: Reported on 08/15/2019)  . cyclobenzaprine (FLEXERIL) 10 MG tablet Take 1 tablet (10 mg total) by mouth at bedtime.  (Patient not taking: Reported on 08/15/2019)  . EPINEPHrine (EPIPEN 2-PAK) 0.3 mg/0.3 mL IJ SOAJ injection Inject 0.3 mLs (0.3 mg total) into the muscle as needed for anaphylaxis.  . fluticasone (FLONASE) 50 MCG/ACT nasal spray Place 2 sprays into both nostrils daily as needed (for stuffy nose).  . montelukast (SINGULAIR) 10 MG tablet Take 1 tablet (10 mg total) by mouth daily.  . predniSONE (DELTASONE) 10 MG tablet Take 4 tablets (40 mg total) by mouth daily with breakfast. (Patient not taking: Reported on 04/07/2018)   No facility-administered encounter medications on file as of 08/15/2019.     Drug Allergies:  No Known Allergies  Family History: Aadam's family history includes Hypertension in his father..  Family history is negative for asthma, hayfever, sinus problems, eczema, hives, food allergies, chronic bronchitis or emphysema.  Social and environmental.  He has a dog in the home.  He is not exposed to cigarette smoking.  He has not smoked cigarettes in the past.  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  Physical Exam: BP 114/80 (BP Location: Right Arm, Patient Position: Sitting, Cuff Size: Normal)   Pulse 71   Temp (!) 96.5 F (35.8 C) (Temporal)   Resp 17   Ht 5' 5.4" (1.661 m)   Wt 199 lb 6.4 oz (90.4 kg)   SpO2 96%   BMI 32.78 kg/m    Physical Exam Vitals reviewed.  Constitutional:      Appearance: Normal appearance. He is obese.  HENT:     Head:     Comments: Eyes normal.  Ears normal.  Nose normal.  Pharynx normal. Neck:     Comments: No thyromegaly Cardiovascular:     Rate and Rhythm: Regular rhythm.     Comments: S1-S2 normal no murmurs Pulmonary:     Comments: Clear to percussion and auscultation Abdominal:     Palpations: Abdomen is soft.     Tenderness: There is no abdominal tenderness.     Comments: No hepatosplenomegaly  Musculoskeletal:     Cervical back: Neck supple.  Lymphadenopathy:     Cervical: No cervical adenopathy.  Skin:    Comments:  Clear  Neurological:     Mental Status: He is alert and oriented to person, place, and time. Mental status is at baseline.  Psychiatric:        Mood and Affect: Mood normal.        Behavior: Behavior normal.        Thought Content: Thought content normal.        Judgment: Judgment normal.     Diagnostics: Allergy skin test were extremely positive to grass pollens, ragweed and some weeds and tree pollens.  On intradermal testing he had mild reactivity to molds, cat, cockroach and dust mites   Assessment  Assessment and Plan: 1. Seasonal allergic rhinitis due to pollen   2. Seasonal allergic conjunctivitis     Meds ordered this encounter  Medications  . fluticasone (FLONASE) 50 MCG/ACT nasal spray    Sig: Place 2 sprays into both nostrils daily as needed (for stuffy nose).    Dispense:  16 g    Refill:  5  . montelukast (SINGULAIR) 10 MG tablet    Sig: Take 1 tablet (10 mg total) by mouth daily.    Dispense:  30 tablet    Refill:  5  . EPINEPHrine (EPIPEN 2-PAK) 0.3 mg/0.3 mL IJ SOAJ injection    Sig: Inject 0.3 mLs (0.3 mg total) into the muscle as needed for anaphylaxis.    Dispense:  2 each    Refill:  1    Patient Instructions  Environmental control of dust, mite and mold Zyrtec 10 mg-take 1 tablet once a day if needed for runny nose or itchy eyes Fluticasone 2 sprays per nostril once a day if needed for stuffy nose Opcon-A-1 drop 3 times a day if needed for itchy eyes Montelukast 10 mg-take 1 tablet once a day to help with your allergic symptoms Prednisone 10 mg tablet-take 2 tablets twice a day for 3 days, 2 tablets on the fourth day, 1 tablet on the fifth day to bring your allergic symptoms under control Follow-up in 1 month but call us if you are not doing well on this treatment plan  Due to the severity of your symptoms, you should be started with allergy injections to grass and trees in 1 vial and weeds in the other vial.  It would be best to start you on allergy  injections in early June after the spring allergy season  Call us if you are not doing well on this treatment plan   Return in about 4 weeks (around 09/12/2019).   Thank you for the opportunity to care for this patient.  Please do not hesitate to contact me with questions.  Penne Lash, M.D.  [facility] [address] [place], [place] [address] [phone]

## 2020-03-09 ENCOUNTER — Telehealth (HOSPITAL_COMMUNITY): Payer: Self-pay

## 2020-03-09 ENCOUNTER — Telehealth: Payer: Self-pay | Admitting: Family

## 2020-03-09 DIAGNOSIS — R69 Illness, unspecified: Secondary | ICD-10-CM

## 2020-03-09 NOTE — Telephone Encounter (Signed)
Called to Discuss with patient about Covid symptoms and the use of the monoclonal antibody infusion for those with mild to moderate Covid symptoms and at a high risk of hospitalization.     Pt appears to qualify for this infusion due to co-morbid conditions and/or a member of an at-risk group in accordance with the FDA Emergency Use Authorization.    Pt stated symptoms started on 11/1, tested positive on 11/4 at [facility], and has headache, sore throat, and cough. Pt qualifies based on BMI>25, SVI. Pt informed an APP will call and verify information and set up appointment.

## 2020-03-09 NOTE — Telephone Encounter (Signed)
Called to discuss with Brandon Suarez about Covid symptoms and potential candidacy for the use of sotrovimab, a combination monoclonal antibody infusion for those with mild to moderate Covid symptoms and at a high risk of hospitalization.     Pt is qualified for this infusion at the infusion center due to co-morbid conditions and/or a member of an at-risk group, however unable to reach patient. VM left.   Honore Wipperfurth,NP

## 2020-03-10 ENCOUNTER — Telehealth (HOSPITAL_COMMUNITY): Payer: Self-pay | Admitting: Oncology

## 2020-03-10 ENCOUNTER — Encounter (HOSPITAL_COMMUNITY): Payer: Self-pay | Admitting: Oncology

## 2020-03-10 NOTE — Telephone Encounter (Signed)
Re: Mab Infusion  Called to Discuss with patient about Covid symptoms and the use of regeneron, a monoclonal antibody infusion for those with mild to moderate Covid symptoms and at a high risk of hospitalization.     Pt is qualified for this infusion at the [facility] infusion center due to co-morbid conditions and/or a member of an at-risk group.    Past Medical History:  Diagnosis Date  . Allergy     Specific risk condition-Asthma    Unable to reach pt. Left MCM.   Mignon Pine, AGNP-C [phone] (Infusion Center Hotline)

## 2020-08-27 ENCOUNTER — Ambulatory Visit: Payer: No Typology Code available for payment source | Admitting: Allergy

## 2020-08-31 ENCOUNTER — Encounter: Payer: Self-pay | Admitting: Allergy

## 2020-08-31 ENCOUNTER — Other Ambulatory Visit: Payer: Self-pay

## 2020-08-31 ENCOUNTER — Ambulatory Visit (INDEPENDENT_AMBULATORY_CARE_PROVIDER_SITE_OTHER): Payer: No Typology Code available for payment source | Admitting: Allergy

## 2020-08-31 VITALS — BP 132/80 | HR 70 | Temp 98.6°F | Resp 18 | Ht 66.0 in | Wt 212.0 lb

## 2020-08-31 DIAGNOSIS — J302 Other seasonal allergic rhinitis: Secondary | ICD-10-CM | POA: Diagnosis not present

## 2020-08-31 DIAGNOSIS — J3089 Other allergic rhinitis: Secondary | ICD-10-CM | POA: Diagnosis not present

## 2020-08-31 DIAGNOSIS — H1013 Acute atopic conjunctivitis, bilateral: Secondary | ICD-10-CM | POA: Diagnosis not present

## 2020-08-31 DIAGNOSIS — H1010 Acute atopic conjunctivitis, unspecified eye: Secondary | ICD-10-CM | POA: Insufficient documentation

## 2020-08-31 DIAGNOSIS — T7840XA Allergy, unspecified, initial encounter: Secondary | ICD-10-CM | POA: Insufficient documentation

## 2020-08-31 MED ORDER — OLOPATADINE HCL 0.1 % OP SOLN: 1.0000 [drp] | Freq: Two times a day (BID) | OPHTHALMIC | 5 refills | Status: AC | PRN

## 2020-08-31 MED ORDER — FLUTICASONE PROPIONATE 50 MCG/ACT NA SUSP: 1.0000 | Freq: Two times a day (BID) | NASAL | 5 refills | Status: AC | PRN

## 2020-08-31 MED ORDER — AZELASTINE HCL 0.1 % NA SOLN: 1.0000 | Freq: Two times a day (BID) | NASAL | 5 refills | Status: DC | PRN

## 2020-08-31 MED ORDER — EPINEPHRINE 0.3 MG/0.3ML IJ SOAJ: 0.3000 mg | INTRAMUSCULAR | 2 refills | Status: DC | PRN

## 2020-08-31 MED ORDER — MONTELUKAST SODIUM 10 MG PO TABS: 10.0000 mg | ORAL_TABLET | Freq: Every day | ORAL | 5 refills | Status: DC

## 2020-08-31 NOTE — Patient Instructions (Addendum)
Environmental allergies  Start prednisone taper. Prednisone 10mg  tablets - take 2 tablets for 4 days then 1 tablet on day 5.    Start environmental control measures as below.  May use over the counter antihistamines such as Zyrtec (cetirizine), Claritin (loratadine), Allegra (fexofenadine), or Xyzal (levocetirizine) daily as needed. May take twice a day if needed.   Continue Singulair (montelukast) 10mg  daily at night.  May use Flonase (fluticasone) nasal spray 1 spray per nostril twice a day as needed for nasal congestion.   May use azelastine nasal spray 1-2 sprays per nostril twice a day as needed for runny nose/drainage.  Nasal saline spray (i.e., Simply Saline) or nasal saline lavage (i.e., NeilMed) is recommended as needed and prior to medicated nasal sprays.  May use olopatadine eye drops 0.1% twice a day as needed for itchy/watery eyes.   Start allergy shots.  Had a detailed discussion with patient/family that clinical history is suggestive of allergic rhinitis, and may benefit from allergy immunotherapy (AIT). Discussed in detail regarding the dosing, schedule, side effects (mild to moderate local allergic reaction and rarely systemic allergic reactions including anaphylaxis), and benefits (significant improvement in nasal symptoms, seasonal flares of asthma) of immunotherapy with the patient. There is significant time commitment involved with allergy shots, which includes weekly immunotherapy injections for first 9-12 months and then biweekly to monthly injections for 3-5 years. Consent was signed.  I have prescribed epinephrine injectable and demonstrated proper use. For mild symptoms you can take over the counter antihistamines such as Benadryl and monitor symptoms closely. If symptoms worsen or if you have severe symptoms including breathing issues, throat closure, significant swelling, whole body hives, severe diarrhea and vomiting, lightheadedness then inject epinephrine and  seek immediate medical care afterwards.  Action plan given.   Follow up in 4 months or sooner if needed.  Schedule for first allergy injection in the [place] office.   Reducing Pollen Exposure . Pollen seasons: trees (spring), grass (summer) and ragweed/weeds (fall). 05-10-1982 Keep windows closed in your home and car to lower pollen exposure.  09-11-1980 air conditioning in the bedroom and throughout the house if possible.  . Avoid going out in dry windy days - especially early morning. . Pollen counts are highest between 5 - 10 AM and on dry, hot and windy days.  . Save outside activities for late afternoon or after a heavy rain, when pollen levels are lower.  . Avoid mowing of grass if you have grass pollen allergy. Marland Kitchen Be aware that pollen can also be transported indoors on people and pets.  . Dry your clothes in an automatic dryer rather than hanging them outside where they might collect pollen.  . Rinse hair and eyes before bedtime.  Mold Control . Mold and fungi can grow on a variety of surfaces provided certain temperature and moisture conditions exist.  . Outdoor molds grow on plants, decaying vegetation and soil. The major outdoor mold, Alternaria and Cladosporium, are found in very high numbers during hot and dry conditions. Generally, a late summer - fall peak is seen for common outdoor fungal spores. Rain will temporarily lower outdoor mold spore count, but counts rise rapidly when the rainy period ends. . The most important indoor molds are Aspergillus and Penicillium. Dark, humid and poorly ventilated basements are ideal sites for mold growth. The next most common sites of mold growth are the bathroom and the kitchen. Outdoor (Seasonal) Mold Control . Use air conditioning and keep windows closed. . Avoid exposure  to decaying vegetation. Marland Kitchen Avoid leaf raking. . Avoid grain handling. . Consider wearing a face mask if working in moldy areas.  Indoor (Perennial) Mold Control   . Maintain humidity below 50%. . Get rid of mold growth on hard surfaces with water, detergent and, if necessary, 5% bleach (do not mix with other cleaners). Then dry the area completely. If mold covers an area more than 10 square feet, consider hiring an indoor environmental professional. . For clothing, washing with soap and water is best. If moldy items cannot be cleaned and dried, throw them away. . Remove sources e.g. contaminated carpets. . Repair and seal leaking roofs or pipes. Using dehumidifiers in damp basements may be helpful, but empty the water and clean units regularly to prevent mildew from forming. All rooms, especially basements, bathrooms and kitchens, require ventilation and cleaning to deter mold and mildew growth. Avoid carpeting on concrete or damp floors, and storing items in damp areas. Pet Allergen Avoidance: . Contrary to popular opinion, there are no "hypoallergenic" breeds of dogs or cats. That is because people are not allergic to an animal's hair, but to an allergen found in the animal's saliva, dander (dead skin flakes) or urine. Pet allergy symptoms typically occur within minutes. For some people, symptoms can build up and become most severe 8 to 12 hours after contact with the animal. People with severe allergies can experience reactions in public places if dander has been transported on the pet owners' clothing. Marland Kitchen Keeping an animal outdoors is only a partial solution, since homes with pets in the yard still have higher concentrations of animal allergens. . Before getting a pet, ask your allergist to determine if you are allergic to animals. If your pet is already considered part of your family, try to minimize contact and keep the pet out of the bedroom and other rooms where you spend a great deal of time. . As with dust mites, vacuum carpets often or replace carpet with a hardwood floor, tile or linoleum. . High-efficiency particulate air (HEPA) cleaners can reduce  allergen levels over time. . While dander and saliva are the source of cat and dog allergens, urine is the source of allergens from rabbits, hamsters, mice and [place] pigs; so ask a non-allergic family member to clean the animal's cage. . If you have a pet allergy, talk to your allergist about the potential for allergy immunotherapy (allergy shots). This strategy can often provide long-term relief. Cockroach Allergen Avoidance Cockroaches are often found in the homes of densely populated urban areas, schools or commercial buildings, but these creatures can lurk almost anywhere. This does not mean that you have a dirty house or living area. . Block all areas where roaches can enter the home. This includes crevices, wall cracks and windows.  . Cockroaches need water to survive, so fix and seal all leaky faucets and pipes. Have an exterminator go through the house when your family and pets are gone to eliminate any remaining roaches. Marland Kitchen Keep food in lidded containers and put pet food dishes away after your pets are done eating. Vacuum and sweep the floor after meals, and take out garbage and recyclables. Use lidded garbage containers in the kitchen. Wash dishes immediately after use and clean under stoves, refrigerators or toasters where crumbs can accumulate. Wipe off the stove and other kitchen surfaces and cupboards regularly. Control of House Dust Mite Allergen . Dust mite allergens are a common trigger of allergy and asthma symptoms. While they can be  found throughout the house, these microscopic creatures thrive in warm, humid environments such as bedding, upholstered furniture and carpeting. . Because so much time is spent in the bedroom, it is essential to reduce mite levels there.  . Encase pillows, mattresses, and box springs in special allergen-proof fabric covers or airtight, zippered plastic covers.  . Bedding should be washed weekly in hot water (130 F) and dried in a hot dryer. Allergen-proof  covers are available for comforters and pillows that can't be regularly washed.  Reyes Ivan the allergy-proof covers every few months. Minimize clutter in the bedroom. Keep pets out of the bedroom.  Marland Kitchen Keep humidity less than 50% by using a dehumidifier or air conditioning. You can buy a humidity measuring device called a hygrometer to monitor this.  . If possible, replace carpets with hardwood, linoleum, or washable area rugs. If that's not possible, vacuum frequently with a vacuum that has a HEPA filter. . Remove all upholstered furniture and non-washable window drapes from the bedroom. . Remove all non-washable stuffed toys from the bedroom.  Wash stuffed toys weekly.

## 2020-08-31 NOTE — Progress Notes (Signed)
Follow Up Note  RE: Brandon Suarez MRN: [id number] DOB: 08/18/1990 Date of Office Visit: 08/31/2020  Referring provider: Jana Hakim, MD Primary care provider: Patient, No Pcp Per (Inactive)  Chief Complaint: Allergic Rhinitis  (Says they are bad. Wheezing, sneezing, puffy eye, itching on face and throat. ) and Eczema (Fine bums on left arm and it is dry mostly. )  History of Present Illness: I had the pleasure of seeing Brandon Suarez for a follow up visit at the [facility] on 08/31/2020. He is a 30 y.o. male, who is being followed for allergic rhinoconjunctivitis. His previous allergy office visit was on 08/15/2019 with Dr. Beaulah Dinning. Today is a regular follow up visit.  Environmental allergies: Patient is having terrible symptoms with facial itching/swelling, itchy/watery eyes, nasal congestion, sneezing, throat itching. Currently taking zyrtec 10mg  daily BID, taking Singulair daily, visine eye drops and Flonase 1-2 spray per nostril daily. No nosebleeds. Wants to start injections.   No recent prednisone. Denies wheezing. No prior asthma diagnosis/inhaler use.   Assessment and Plan: Brandon Suarez is a 30 y.o. male with: Seasonal and perennial allergic rhinoconjunctivitis Past history - 2021 skin testing was positive to grass, ragweed, weeds, trees, molds, cat, cockroach and dust mites. Interim history - symptoms not controlled. Wants to start shots.  Start prednisone taper. Prednisone 10mg  tablets - take 2 tablets for 4 days then 1 tablet on day 5.   Start environmental control measures as below.  May use over the counter antihistamines such as Zyrtec (cetirizine), Claritin (loratadine), Allegra (fexofenadine), or Xyzal (levocetirizine) daily as needed. May take twice a day if needed.   Continue Singulair (montelukast) 10mg  daily at night.  May use Flonase (fluticasone) nasal spray 1 spray per nostril twice a day as needed for nasal congestion.   May use  azelastine nasal spray 1-2 sprays per nostril twice a day as needed for runny nose/drainage.  Nasal saline spray (i.e., Simply Saline) or nasal saline lavage (i.e., NeilMed) is recommended as needed and prior to medicated nasal sprays.  May use olopatadine eye drops 0.1% twice a day as needed for itchy/watery eyes.  Start allergy immunotherapy.  Had a detailed discussion with patient/family that clinical history is suggestive of allergic rhinitis, and may benefit from allergy immunotherapy (AIT). Discussed in detail regarding the dosing, schedule, side effects (mild to moderate local allergic reaction and rarely systemic allergic reactions including anaphylaxis), and benefits (significant improvement in nasal symptoms, seasonal flares of asthma) of immunotherapy with the patient. There is significant time commitment involved with allergy shots, which includes weekly immunotherapy injections for first 9-12 months and then biweekly to monthly injections for 3-5 years. Consent was signed.  I have prescribed epinephrine injectable and demonstrated proper use. For mild symptoms you can take over the counter antihistamines such as Benadryl and monitor symptoms closely. If symptoms worsen or if you have severe symptoms including breathing issues, throat closure, significant swelling, whole body hives, severe diarrhea and vomiting, lightheadedness then inject epinephrine and seek immediate medical care afterwards. Action plan given.   Return in about 4 months (around 12/31/2020).  Meds ordered this encounter  Medications  . montelukast (SINGULAIR) 10 MG tablet    Sig: Take 1 tablet (10 mg total) by mouth at bedtime.    Dispense:  30 tablet    Refill:  5  . fluticasone (FLONASE) 50 MCG/ACT nasal spray    Sig: Place 1 spray into both nostrils 2 (two) times daily as needed (nasal congestion).  Dispense:  16 g    Refill:  5  . azelastine (ASTELIN) 0.1 % nasal spray    Sig: Place 1-2 sprays into both  nostrils 2 (two) times daily as needed (runny nose). Use in each nostril as directed    Dispense:  30 mL    Refill:  5  . olopatadine (PATANOL) 0.1 % ophthalmic solution    Sig: Place 1 drop into both eyes 2 (two) times daily as needed (itchy/watery).    Dispense:  5 mL    Refill:  5  . EPINEPHrine 0.3 mg/0.3 mL IJ SOAJ injection    Sig: Inject 0.3 mg into the muscle as needed.    Dispense:  1 each    Refill:  2    May dispense generic/Mylan/Teva brand.   Lab Orders  No laboratory test(s) ordered today    Diagnostics: None.  Medication List:  Current Outpatient Medications  Medication Sig Dispense Refill  . acyclovir (ZOVIRAX) 400 MG tablet Take 1 tablet 3 times daily for 5 to 10 days for cold sore flare ups. 30 tablet 5  . azelastine (ASTELIN) 0.1 % nasal spray Place 1-2 sprays into both nostrils 2 (two) times daily as needed (runny nose). Use in each nostril as directed 30 mL 5  . cetirizine (ZYRTEC) 10 MG tablet Take by mouth.    . clotrimazole-betamethasone (LOTRISONE) cream Apply 1 application topically 2 (two) times daily. 30 g 3  . cyclobenzaprine (FLEXERIL) 10 MG tablet Take 1 tablet (10 mg total) by mouth at bedtime. 20 tablet 0  . EPINEPHrine 0.3 mg/0.3 mL IJ SOAJ injection Inject 0.3 mg into the muscle as needed. 1 each 2  . fluticasone (FLONASE) 50 MCG/ACT nasal spray Place 1 spray into both nostrils 2 (two) times daily as needed (nasal congestion). 16 g 5  . ibuprofen (ADVIL) 800 MG tablet TAKE ONE TABLET BY MOUTH TWICE A DAY AS NEEDED (TAKE WITH FOOD)    . meloxicam (MOBIC) 15 MG tablet     . metaxalone (SKELAXIN) 800 MG tablet metaxalone 800 mg oral tablet Start Date: 06/03/19 Status: Ordered    . montelukast (SINGULAIR) 10 MG tablet Take 1 tablet (10 mg total) by mouth at bedtime. 30 tablet 5  . naproxen (NAPROSYN) 500 MG tablet     . olopatadine (PATANOL) 0.1 % ophthalmic solution Place 1 drop into both eyes 2 (two) times daily as needed (itchy/watery). 5 mL 5  .  valACYclovir (VALTREX) 1000 MG tablet      No current facility-administered medications for this visit.   Allergies: No Known Allergies I reviewed his past medical history, social history, family history, and environmental history and no significant changes have been reported from his previous visit.  Review of Systems  Constitutional: Negative for appetite change, chills, fever and unexpected weight change.  HENT: Positive for postnasal drip, rhinorrhea and sneezing. Negative for congestion.   Eyes: Positive for itching.  Respiratory: Negative for cough, chest tightness, shortness of breath and wheezing.   Gastrointestinal: Negative for abdominal pain.  Skin: Negative for rash.  Allergic/Immunologic: Positive for environmental allergies.  Neurological: Negative for headaches.   Objective: BP 132/80   Pulse 70   Temp 98.6 F (37 C)   Resp 18   Ht 5\' 6"  (1.676 m)   Wt 212 lb (96.2 kg)   SpO2 98%   BMI 34.22 kg/m  Body mass index is 34.22 kg/m. Physical Exam Vitals and nursing note reviewed.  Constitutional:  Appearance: Normal appearance. He is well-developed.  HENT:     Head: Normocephalic and atraumatic.     Right Ear: External ear normal.     Left Ear: External ear normal.     Nose: Nose normal.     Mouth/Throat:     Mouth: Mucous membranes are moist.     Pharynx: Oropharynx is clear.  Eyes:     Conjunctiva/sclera: Conjunctivae normal.  Cardiovascular:     Rate and Rhythm: Normal rate and regular rhythm.     Heart sounds: Normal heart sounds. No murmur heard.   Pulmonary:     Effort: Pulmonary effort is normal.     Breath sounds: Normal breath sounds. No wheezing, rhonchi or rales.  Musculoskeletal:     Cervical back: Neck supple.  Skin:    General: Skin is warm.     Findings: No rash.  Neurological:     Mental Status: He is alert and oriented to person, place, and time.  Psychiatric:        Behavior: Behavior normal.    Previous notes and tests  were reviewed. The plan was reviewed with the patient/family, and all questions/concerned were addressed.  It was my pleasure to see Eward today and participate in his care. Please feel free to contact me with any questions or concerns.  Sincerely,  Wyline Mood, DO Allergy & Immunology  [facility] office: [phone] [facility] office: [phone]

## 2020-08-31 NOTE — Assessment & Plan Note (Signed)
Past history - 2021 skin testing was positive to grass, ragweed, weeds, trees, molds, cat, cockroach and dust mites. Interim history - symptoms not controlled. Wants to start shots.  Start prednisone taper. Prednisone 10mg  tablets - take 2 tablets for 4 days then 1 tablet on day 5.   Start environmental control measures as below.  May use over the counter antihistamines such as Zyrtec (cetirizine), Claritin (loratadine), Allegra (fexofenadine), or Xyzal (levocetirizine) daily as needed. May take twice a day if needed.   Continue Singulair (montelukast) 10mg  daily at night.  May use Flonase (fluticasone) nasal spray 1 spray per nostril twice a day as needed for nasal congestion.   May use azelastine nasal spray 1-2 sprays per nostril twice a day as needed for runny nose/drainage.  Nasal saline spray (i.e., Simply Saline) or nasal saline lavage (i.e., NeilMed) is recommended as needed and prior to medicated nasal sprays.  May use olopatadine eye drops 0.1% twice a day as needed for itchy/watery eyes.  Start allergy immunotherapy.  Had a detailed discussion with patient/family that clinical history is suggestive of allergic rhinitis, and may benefit from allergy immunotherapy (AIT). Discussed in detail regarding the dosing, schedule, side effects (mild to moderate local allergic reaction and rarely systemic allergic reactions including anaphylaxis), and benefits (significant improvement in nasal symptoms, seasonal flares of asthma) of immunotherapy with the patient. There is significant time commitment involved with allergy shots, which includes weekly immunotherapy injections for first 9-12 months and then biweekly to monthly injections for 3-5 years. Consent was signed.  I have prescribed epinephrine injectable and demonstrated proper use. For mild symptoms you can take over the counter antihistamines such as Benadryl and monitor symptoms closely. If symptoms worsen or if you have severe  symptoms including breathing issues, throat closure, significant swelling, whole body hives, severe diarrhea and vomiting, lightheadedness then inject epinephrine and seek immediate medical care afterwards. Action plan given.

## 2020-09-03 DIAGNOSIS — J302 Other seasonal allergic rhinitis: Secondary | ICD-10-CM

## 2020-09-03 NOTE — Progress Notes (Signed)
Aeroallergen Immunotherapy   Ordering Provider: Dr. Wyline Mood   Patient Details  Name: Brandon Suarez  MRN: [id number]  Date of Birth: 1991-03-30   Order 2 of 2   Vial Label: M-C-Cr-Dm   0.2 ml (Volume) 1:20 Concentration -- Alternaria alternata  0.2 ml (Volume) 1:20 Concentration -- Cladosporium herbarum  0.2 ml (Volume) 1:10 Concentration -- Aspergillus mix  0.2 ml (Volume) 1:10 Concentration -- Penicillium mix  0.2 ml (Volume) 1:20 Concentration -- Bipolaris sorokiniana  0.2 ml (Volume) 1:20 Concentration -- Drechslera spicifera  0.2 ml (Volume) 1:10 Concentration -- Mucor plumbeus  0.2 ml (Volume) 1:10 Concentration -- Fusarium moniliforme  0.2 ml (Volume) 1:40 Concentration -- Aureobasidium pullulans  0.2 ml (Volume) 1:10 Concentration -- Rhizopus oryzae  0.5 ml (Volume) 1:10 Concentration -- Cat Hair  0.3 ml (Volume) 1:20 Concentration -- Cockroach, German  0.5 ml (Volume)  AU Concentration -- Mite Mix (DF 5,000 & DP 5,000)    3.3 ml Extract Subtotal  1.7 ml Diluent  5.0 ml Maintenance Total   Blue Vial (1:100,000): Schedule B (6 doses)  Yellow Vial (1:10,000): Schedule B (6 doses)  Green Vial (1:1,000): Schedule B (6 doses)  Red Vial (1:100): Schedule A (10 doses)   Special Instructions: once per week

## 2020-09-03 NOTE — Progress Notes (Signed)
Aeroallergen Immunotherapy   Ordering Provider: Dr. Wyline Mood   Patient Details  Name: Brandon Suarez  MRN: [id number]  Date of Birth: [birth year]   Order 1 of 2   Vial Label: G-Rw-W-T   0.3 ml (Volume) BAU Concentration -- 7 Grass Mix* 100,000 ([address] [place], [place], [place], [place], [place], Sweet Vernal, [place])  0.3 ml (Volume) BAU Concentration -- [place] 10,000  0.2 ml (Volume) 1:20 Concentration -- Johnson  0.3 ml (Volume) 1:20 Concentration -- Ragweed Mix  0.5 ml (Volume) 1:20 Concentration -- Weed Mix*  0.5 ml (Volume) 1:20 Concentration -- Eastern 10 Tree Mix (also Sweet Gum)  0.2 ml (Volume) 1:10 Concentration -- Cedar, red  0.2 ml (Volume) 1:10 Concentration -- Pecan Pollen  0.2 ml (Volume) 1:20 Concentration -- Walnut, Black Pollen    2.7 ml Extract Subtotal  2.3 ml Diluent  5.0 ml Maintenance Total   Schedule: B  Blue Vial (1:100,000): Schedule B (6 doses)  Yellow Vial (1:10,000): Schedule B (6 doses)  Green Vial (1:1,000): Schedule B (6 doses)  Red Vial (1:100): Schedule A (10 doses)   Special Instructions: once per week

## 2020-09-03 NOTE — Progress Notes (Signed)
VIALS EXP 09-03-21 

## 2020-09-04 DIAGNOSIS — J3089 Other allergic rhinitis: Secondary | ICD-10-CM | POA: Diagnosis not present

## 2020-09-21 ENCOUNTER — Other Ambulatory Visit: Payer: Self-pay

## 2020-09-21 ENCOUNTER — Ambulatory Visit (INDEPENDENT_AMBULATORY_CARE_PROVIDER_SITE_OTHER): Payer: No Typology Code available for payment source

## 2020-09-21 DIAGNOSIS — J309 Allergic rhinitis, unspecified: Secondary | ICD-10-CM | POA: Diagnosis not present

## 2020-09-21 NOTE — Progress Notes (Signed)
Immunotherapy   Patient Details  Name: Brandon Suarez MRN: [id number] Date of Birth: October 13, 1990  09/21/2020  Carlus Pavlov started injections for G-RW-W-T and M-C-CR-DM Following schedule: B  Frequency:1 time per week Epi-Pen:Epi-Pen Available  Consent signed and patient instructions given. Patient waited 30 minutes in office without any issues.   Deborra Medina 09/21/2020, 2:04 PM

## 2020-09-25 ENCOUNTER — Ambulatory Visit (INDEPENDENT_AMBULATORY_CARE_PROVIDER_SITE_OTHER): Payer: No Typology Code available for payment source | Admitting: *Deleted

## 2020-09-25 DIAGNOSIS — J309 Allergic rhinitis, unspecified: Secondary | ICD-10-CM

## 2020-10-10 ENCOUNTER — Ambulatory Visit (INDEPENDENT_AMBULATORY_CARE_PROVIDER_SITE_OTHER): Payer: No Typology Code available for payment source

## 2020-10-10 DIAGNOSIS — J309 Allergic rhinitis, unspecified: Secondary | ICD-10-CM

## 2020-10-16 ENCOUNTER — Ambulatory Visit (INDEPENDENT_AMBULATORY_CARE_PROVIDER_SITE_OTHER): Payer: No Typology Code available for payment source | Admitting: *Deleted

## 2020-10-16 DIAGNOSIS — J309 Allergic rhinitis, unspecified: Secondary | ICD-10-CM

## 2020-10-25 ENCOUNTER — Ambulatory Visit (INDEPENDENT_AMBULATORY_CARE_PROVIDER_SITE_OTHER): Payer: No Typology Code available for payment source | Admitting: *Deleted

## 2020-10-25 DIAGNOSIS — J309 Allergic rhinitis, unspecified: Secondary | ICD-10-CM

## 2020-11-06 ENCOUNTER — Ambulatory Visit (INDEPENDENT_AMBULATORY_CARE_PROVIDER_SITE_OTHER): Payer: No Typology Code available for payment source | Admitting: *Deleted

## 2020-11-06 DIAGNOSIS — J309 Allergic rhinitis, unspecified: Secondary | ICD-10-CM | POA: Diagnosis not present

## 2020-11-13 ENCOUNTER — Ambulatory Visit (INDEPENDENT_AMBULATORY_CARE_PROVIDER_SITE_OTHER): Payer: No Typology Code available for payment source | Admitting: *Deleted

## 2020-11-13 DIAGNOSIS — J309 Allergic rhinitis, unspecified: Secondary | ICD-10-CM | POA: Diagnosis not present

## 2020-11-20 ENCOUNTER — Ambulatory Visit (INDEPENDENT_AMBULATORY_CARE_PROVIDER_SITE_OTHER): Payer: No Typology Code available for payment source

## 2020-11-20 DIAGNOSIS — J309 Allergic rhinitis, unspecified: Secondary | ICD-10-CM

## 2020-11-26 ENCOUNTER — Encounter: Payer: Self-pay | Admitting: *Deleted

## 2020-11-27 ENCOUNTER — Other Ambulatory Visit: Payer: Self-pay | Admitting: Neurology

## 2020-11-27 ENCOUNTER — Ambulatory Visit (INDEPENDENT_AMBULATORY_CARE_PROVIDER_SITE_OTHER): Payer: No Typology Code available for payment source | Admitting: Neurology

## 2020-11-27 ENCOUNTER — Ambulatory Visit (INDEPENDENT_AMBULATORY_CARE_PROVIDER_SITE_OTHER): Payer: No Typology Code available for payment source | Admitting: *Deleted

## 2020-11-27 ENCOUNTER — Other Ambulatory Visit: Payer: Self-pay

## 2020-11-27 ENCOUNTER — Encounter: Payer: Self-pay | Admitting: Neurology

## 2020-11-27 VITALS — BP 125/77 | HR 65 | Ht 66.75 in | Wt 214.4 lb

## 2020-11-27 DIAGNOSIS — G43009 Migraine without aura, not intractable, without status migrainosus: Secondary | ICD-10-CM | POA: Insufficient documentation

## 2020-11-27 DIAGNOSIS — G44209 Tension-type headache, unspecified, not intractable: Secondary | ICD-10-CM

## 2020-11-27 DIAGNOSIS — R519 Headache, unspecified: Secondary | ICD-10-CM

## 2020-11-27 DIAGNOSIS — R51 Headache with orthostatic component, not elsewhere classified: Secondary | ICD-10-CM

## 2020-11-27 DIAGNOSIS — J309 Allergic rhinitis, unspecified: Secondary | ICD-10-CM | POA: Diagnosis not present

## 2020-11-27 DIAGNOSIS — G4730 Sleep apnea, unspecified: Secondary | ICD-10-CM

## 2020-11-27 DIAGNOSIS — H93A9 Pulsatile tinnitus, unspecified ear: Secondary | ICD-10-CM

## 2020-11-27 MED ORDER — PROPRANOLOL HCL ER 60 MG PO CP24: 60.0000 mg | ORAL_CAPSULE | Freq: Every day | ORAL | 3 refills | Status: DC

## 2020-11-27 MED ORDER — ALPRAZOLAM 0.25 MG PO TABS: ORAL_TABLET | ORAL | 0 refills | Status: DC

## 2020-11-27 NOTE — Progress Notes (Signed)
[facility]    Provider:  Dr Lucia Gaskins Requesting Provider: Jana Hakim, MD Primary Care Provider:  Patient, No Pcp Per (Inactive)  CC:  headache  HPI:  Brandon Suarez is a 30 y.o. male here as requested by Jana Hakim, MD for daily headaches. PMHx sleep apnea, insomnia, depression, arthritis. He is not using his cpap. The headaches "come and go", whenever, he can be at work or sitting on the couch watching TV, he can wake with them in the mornings, mouth is dry in the mornings, He is not sure if his sleep apnea is mild. He has constant throbbing, he can hear the throbbing, it is on both sides, sometimes in the temples, sometimes in the backer part of the back of the head, doesn't last an hour, maybe 20 minutes, a cool area helps, noise makes it worse, bright lights make it worse, ibuprofen may help a little. And ice pak helps. No nausea or vomiting. No vision changes. No other focal neurologic symptoms. They get worse bendingstooping and working out. Pulsatile tinnitus. Started a few months ago, no inciting events, no head trauma, no new medications.   Reviewed notes, labs and imaging from outside physicians, which showed:   10/07/2020: Cbc/cmp: unremarkable   Medications tried that can be used in migraine management include: Flexeril, ibuprofen, melatonin, meloxicam, naproxen, prednisone.  Review of Systems: Patient complains of symptoms per HPI as well as the following symptoms headaches. Pertinent negatives and positives per HPI. All others negative.   Social History   Socioeconomic History   Marital status: Single    Spouse name: Not on file   Number of children: Not on file   Years of education: Not on file   Highest education level: Not on file  Occupational History   Not on file  Tobacco Use   Smoking status: Never   Smokeless tobacco: Never  Vaping Use   Vaping Use: Never used  Substance and Sexual Activity   Alcohol use: Yes    Comment: 2    Drug use: Never   Sexual activity: Yes  Other Topics Concern   Not on file  Social History Narrative   Caffeine none, soda's none.  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     Social Determinants of Health   Financial Resource Strain: Not on file  Food Insecurity: Not on file  Transportation Needs: Not on file  Physical Activity: Not on file  Stress: Not on file  Social Connections: Not on file  Intimate Partner Violence: Not on file    Family History  Problem Relation Age of Onset   Hypertension Father    Migraines Neg Hx     Past Medical History:  Diagnosis Date   Allergy    Headache     Patient Active Problem List   Diagnosis Date Noted   Migraine without aura and without status migrainosus, not intractable 11/27/2020   Tension headache 11/27/2020   Sleep apnea 11/27/2020   Allergy 08/31/2020   Seasonal and perennial allergic rhinoconjunctivitis 08/31/2020   Acute lumbar myofascial strain 04/07/2018   Muscle spasm 04/07/2018   Musculoskeletal pain 04/07/2018   Chronic pain of both feet 03/25/2018   Athlete's foot on right 03/25/2018   Seasonal allergic rhinitis due to pollen 12/02/2016    History reviewed. No pertinent surgical history.  Current Outpatient Medications  Medication Sig Dispense Refill   ALPRAZolam (XANAX) 0.25 MG tablet Take 1-2 tabs (0.25mg -0.50mg ) 30-60 minutes before procedure. May repeat if needed.Do not  drive. 4 tablet 0   cetirizine (ZYRTEC) 10 MG tablet Take by mouth.     clotrimazole-betamethasone (LOTRISONE) cream Apply 1 application topically 2 (two) times daily. 30 g 3   EPINEPHrine 0.3 mg/0.3 mL IJ SOAJ injection Inject 0.3 mg into the muscle as needed. 1 each 2   ibuprofen (ADVIL) 800 MG tablet TAKE ONE TABLET BY MOUTH TWICE A DAY AS NEEDED (TAKE WITH FOOD)     melatonin 3 MG TABS tablet Take 6 mg by mouth at bedtime.     metaxalone (SKELAXIN) 800 MG tablet metaxalone 800 mg oral tablet Start Date: 06/03/19 Status: Ordered      montelukast (SINGULAIR) 10 MG tablet Take 1 tablet (10 mg total) by mouth at bedtime. 30 tablet 5   olopatadine (PATANOL) 0.1 % ophthalmic solution Place 1 drop into both eyes 2 (two) times daily as needed (itchy/watery). 5 mL 5   pantoprazole (PROTONIX) 20 MG tablet      propranolol ER (INDERAL LA) 60 MG 24 hr capsule Take 1 capsule (60 mg total) by mouth at bedtime. 30 capsule 3   valACYclovir (VALTREX) 1000 MG tablet 1,000 mg daily. Prn for cold sores.     azelastine (ASTELIN) 0.1 % nasal spray Place 1-2 sprays into both nostrils 2 (two) times daily as needed (runny nose). Use in each nostril as directed (Patient not taking: Reported on 11/27/2020) 30 mL 5   fluticasone (FLONASE) 50 MCG/ACT nasal spray Place 1 spray into both nostrils 2 (two) times daily as needed (nasal congestion). (Patient not taking: Reported on 11/27/2020) 16 g 5   naproxen (NAPROSYN) 500 MG tablet  (Patient not taking: Reported on 11/27/2020)     No current facility-administered medications for this visit.    Allergies as of 11/27/2020   (No Known Allergies)    Vitals: BP 125/77   Pulse 65   Ht 5' 6.75" (1.695 m)   Wt 214 lb 6.4 oz (97.3 kg)   BMI 33.83 kg/m  Last Weight:  Wt Readings from Last 1 Encounters:  11/27/20 214 lb 6.4 oz (97.3 kg)   Last Height:   Ht Readings from Last 1 Encounters:  11/27/20 5' 6.75" (1.695 m)     Physical exam: Exam: Gen: NAD, conversant, well nourised, obese, well groomed                     CV: RRR, no MRG. No Carotid Bruits. No peripheral edema, warm, nontender Eyes: Conjunctivae clear without exudates or hemorrhage  Neuro: Detailed Neurologic Exam  Speech:    Speech is normal; fluent and spontaneous with normal comprehension.  Cognition:    The patient is oriented to person, place, and time;     recent and remote memory intact;     language fluent;     normal attention, concentration,     fund of knowledge Cranial Nerves:    The pupils are equal, round, and  reactive to light. The fundi are normal and spontaneous venous pulsations are present. Visual fields are full to finger confrontation. Extraocular movements are intact. Trigeminal sensation is intact and the muscles of mastication are normal. The face is symmetric. The palate elevates in the midline. Hearing intact. Voice is normal. Shoulder shrug is normal. The tongue has normal motion without fasciculations.   Coordination:    Normal   Gait:    Heel-toe and tandem gait are normal.   Motor Observation:    No asymmetry, no atrophy, and no involuntary movements noted.  Tone:    Normal muscle tone.    Posture:    Posture is normal. normal erect    Strength:    Strength is V/V in the upper and lower limbs.      Sensation: intact to LT     Reflex Exam:  DTR's:    Deep tendon reflexes in the upper and lower extremities are normal bilaterally.   Toes:    The toes are downgoing bilaterally.   Clonus:    Clonus is absent.    Assessment/Plan:  30 y.o. male here as requested by Jana Hakim, MD for daily headaches. PMHx untreated sleep apnea, insomnia, depression, arthritis. He is not using his cpap. Waiting for "bongo". I also mentioned inspire.   Tension type headaches. Has some migrainous qualities but doesn't fit clinical criteria for migraines however may be developing into migraines. May also be due to his untreated sleep apnea.  F/u 3 months in the meantime give me updates via MyChart  MRI brain and blood vessels One lab today - TSH Start propranolol for possible migraines  treat sleep apnea (may be a cause of headaches)  Orders Placed This Encounter  Procedures   MR BRAIN W WO CONTRAST   MR ANGIO HEAD WO CONTRAST   TSH    Meds ordered this encounter  Medications   ALPRAZolam (XANAX) 0.25 MG tablet    Sig: Take 1-2 tabs (0.25mg -0.50mg ) 30-60 minutes before procedure. May repeat if needed.Do not drive.    Dispense:  4 tablet    Refill:  0   propranolol ER (INDERAL  LA) 60 MG 24 hr capsule    Sig: Take 1 capsule (60 mg total) by mouth at bedtime.    Dispense:  30 capsule    Refill:  3     Cc: Jana Hakim, MD,  Patient, No Pcp Per (Inactive)  Naomie Dean, MD  [facility] [address] [place], [place] [address]  Phone [phone] Fax [phone]

## 2020-11-27 NOTE — Patient Instructions (Addendum)
MRI brain and blood vessels One lab today Start propranolol for possible migraines treat sleep apnea (may be a cause of headaches) May be migraines or sleep apnea or tension-type headaches  Tension Headache, Adult A tension headache is a feeling of pain, pressure, or aching over the front and sides of the head. The pain can be dull, or it can feel tight. There are two types of tension headache: Episodic tension headache. This is when the headaches happen fewer than 15 days a month. Chronic tension headache. This is when the headaches happen more than 15 days a month during a 4736-month period. A tension headache can last from 30 minutes to several days. It is the most common kind of headache. Tension headaches are not normally associated withnausea or vomiting, and they do not get worse with physical activity. What are the causes? The exact cause of this condition is not known. Tension headaches are often triggered by stress, anxiety, or depression. Other triggers may include: Alcohol. Too much caffeine or caffeine withdrawal. Respiratory infections, such as colds, flu, or sinus infections. Dental problems or teeth clenching. Fatigue. Holding your head and neck in the same position for a long period of time, such as while using a computer. Smoking. Arthritis of the neck. What are the signs or symptoms? Symptoms of this condition include: A feeling of pressure or tightness around the head. Dull, aching head pain. Pain over the front and sides of the head. Tenderness in the muscles of the head, neck, and shoulders. How is this diagnosed? This condition may be diagnosed based on your symptoms, your medical history,and a physical exam. If your symptoms are severe or unusual, you may have imaging tests, such as aCT scan or an MRI of your head. Your vision may also be checked. How is this treated? This condition may be treated with lifestyle changes and with medicines thathelp relieve  symptoms. Follow these instructions at home: Managing pain Take over-the-counter and prescription medicines only as told by your health care provider. When you have a headache, lie down in a dark, quiet room. If directed, put ice on your head and neck. To do this: Put ice in a plastic bag. Place a towel between your skin and the bag. Leave the ice on for 20 minutes, 2-3 times a day. Remove the ice if your skin turns bright red. This is very important. If you cannot feel pain, heat, or cold, you have a greater risk of damage to the area. If directed, apply heat to the back of your neck as often as told by your health care provider. Use the heat source that your health care provider recommends, such as a moist heat pack or a heating pad. Place a towel between your skin and the heat source. Leave the heat on for 20-30 minutes. Remove the heat if your skin turns bright red. This is especially important if you are unable to feel pain, heat, or cold. You have a greater risk of getting burned. Eating and drinking Eat meals on a regular schedule. If you drink alcohol: Limit how much you have to: 0-1 drink a day for women who are not pregnant. 0-2 drinks a day for men. Know how much alcohol is in your drink. In the [place] one drink equals one 12 oz bottle of beer (355 mL), one 5 oz glass of wine (148 mL), or one 1 oz glass of hard liquor (44 mL). Drink enough fluid to keep your urine pale yellow. Decrease your  caffeine intake, or stop using caffeine. Lifestyle Get 7-9 hours of sleep each night, or get the amount of sleep recommended by your health care provider. At bedtime, remove computers, phones, and tablets from your room. Find ways to manage your stress. This may include: Exercise. Deep breathing exercises. Yoga. Listening to music. Positive mental imagery. Try to sit up straight and avoid tensing your muscles. Do not use any products that contain nicotine or tobacco. These include  cigarettes, chewing tobacco, and vaping devices, such as e-cigarettes. If you need help quitting, ask your health care provider. General instructions  Avoid any headache triggers. Keep a journal to help find out what may trigger your headaches. For example, write down: What you eat and drink. How much sleep you get. Any change to your diet or medicines. Keep all follow-up visits. This is important.  Contact a health care provider if: Your headache does not get better. Your headache comes back. You are sensitive to sounds, light, or smells because of a headache. You have nausea or you vomit. Your stomach hurts. Get help right away if: You suddenly develop a severe headache, along with any of the following: A stiff neck. Nausea and vomiting. Confusion. Weakness in one part or one side of your body. Double vision or loss of vision. Shortness of breath. Rash. Unusual sleepiness. Fever or chills. Trouble speaking. Pain in your eye or ear. Trouble walking or balancing. Feeling faint or passing out. Summary A tension headache is a feeling of pain, pressure, or aching over the front and sides of the head. A tension headache can last from 30 minutes to several days. It is the most common kind of headache. This condition may be diagnosed based on your symptoms, your medical history, and a physical exam. This condition may be treated with lifestyle changes and with medicines that help relieve symptoms. This information is not intended to replace advice given to you by your health care provider. Make sure you discuss any questions you have with your healthcare provider. Document Revised: 01/19/2020 Document Reviewed: 01/19/2020 Elsevier Patient Education  2022 Elsevier Inc.   Migraine Headache A migraine headache is an intense, throbbing pain on one side or both sides of the head. Migraine headaches may also cause other symptoms, such as nausea, vomiting, and sensitivity to light and  noise. A migraine headache can last from 4 hours to 3 days. Talk with your doctor about what things may bring on (trigger) your migraine headaches. What are the causes? The exact cause of this condition is not known. However, a migraine may be caused when nerves in the brain become irritated and release chemicals that cause inflammation of blood vessels. This inflammation causes pain. This condition may be triggered or caused by: Drinking alcohol. Smoking. Taking medicines, such as: Medicine used to treat chest pain (nitroglycerin). Birth control pills. Estrogen. Certain blood pressure medicines. Eating or drinking products that contain nitrates, glutamate, aspartame, or tyramine. Aged cheeses, chocolate, or caffeine may also be triggers. Doing physical activity. Other things that may trigger a migraine headache include: Menstruation. Pregnancy. Hunger. Stress. Lack of sleep or too much sleep. Weather changes. Fatigue. What increases the risk? The following factors may make you more likely to experience migraine headaches: Being a certain age. This condition is more common in people who are 47-62 years old. Being male. Having a family history of migraine headaches. Being Caucasian. Having a mental health condition, such as depression or anxiety. Being obese. What are the signs or symptoms?  The main symptom of this condition is pulsating or throbbing pain. This pain may: Happen in any area of the head, such as on one side or both sides. Interfere with daily activities. Get worse with physical activity. Get worse with exposure to bright lights or loud noises. Other symptoms may include: Nausea. Vomiting. Dizziness. General sensitivity to bright lights, loud noises, or smells. Before you get a migraine headache, you may get warning signs (an aura). An aura may include: Seeing flashing lights or having blind spots. Seeing bright spots, halos, or zigzag lines. Having tunnel  vision or blurred vision. Having numbness or a tingling feeling. Having trouble talking. Having muscle weakness. Some people have symptoms after a migraine headache (postdromal phase), such as: Feeling tired. Difficulty concentrating. How is this diagnosed? A migraine headache can be diagnosed based on: Your symptoms. A physical exam. Tests, such as: CT scan or an MRI of the head. These imaging tests can help rule out other causes of headaches. Taking fluid from the spine (lumbar puncture) and analyzing it (cerebrospinal fluid analysis, or CSF analysis). How is this treated? This condition may be treated with medicines that: Relieve pain. Relieve nausea. Prevent migraine headaches. Treatment for this condition may also include: Acupuncture. Lifestyle changes like avoiding foods that trigger migraine headaches. Biofeedback. Cognitive behavioral therapy. Follow these instructions at home: Medicines Take over-the-counter and prescription medicines only as told by your health care provider. Ask your health care provider if the medicine prescribed to you: Requires you to avoid driving or using heavy machinery. Can cause constipation. You may need to take these actions to prevent or treat constipation: Drink enough fluid to keep your urine pale yellow. Take over-the-counter or prescription medicines. Eat foods that are high in fiber, such as beans, whole grains, and fresh fruits and vegetables. Limit foods that are high in fat and processed sugars, such as fried or sweet foods. Lifestyle Do not drink alcohol. Do not use any products that contain nicotine or tobacco, such as cigarettes, e-cigarettes, and chewing tobacco. If you need help quitting, ask your health care provider. Get at least 8 hours of sleep every night. Find ways to manage stress, such as meditation, deep breathing, or yoga. General instructions     Keep a journal to find out what may trigger your migraine  headaches. For example, write down: What you eat and drink. How much sleep you get. Any change to your diet or medicines. If you have a migraine headache: Avoid things that make your symptoms worse, such as bright lights. It may help to lie down in a dark, quiet room. Do not drive or use heavy machinery. Ask your health care provider what activities are safe for you while you are experiencing symptoms. Keep all follow-up visits as told by your health care provider. This is important. Contact a health care provider if: You develop symptoms that are different or more severe than your usual migraine headache symptoms. You have more than 15 headache days in one month. Get help right away if: Your migraine headache becomes severe. Your migraine headache lasts longer than 72 hours. You have a fever. You have a stiff neck. You have vision loss. Your muscles feel weak or like you cannot control them. You start to lose your balance often. You have trouble walking. You faint. You have a seizure. Summary A migraine headache is an intense, throbbing pain on one side or both sides of the head. Migraines may also cause other symptoms, such as nausea,  vomiting, and sensitivity to light and noise. This condition may be treated with medicines and lifestyle changes. You may also need to avoid certain things that trigger a migraine headache. Keep a journal to find out what may trigger your migraine headaches. Contact your health care provider if you have more than 15 headache days in a month or you develop symptoms that are different or more severe than your usual migraine headache symptoms. This information is not intended to replace advice given to you by your health care provider. Make sure you discuss any questions you have with your healthcare provider. Document Revised: 08/13/2018 Document Reviewed: 06/03/2018 Elsevier Patient Education  2022 Elsevier Inc. Sleep Apnea Sleep apnea is a condition  in which breathing pauses or becomes shallow during sleep. People with sleep apnea usually snore loudly. They may have times when they gasp and stop breathing for 10 seconds or more during sleep. This mayhappen many times during the night. Sleep apnea disrupts your sleep and keeps your body from getting the rest that it needs. This condition can increase your risk of certain health problems, including: Heart attack. Stroke. Obesity. Type 2 diabetes. Heart failure. Irregular heartbeat. High blood pressure. The goal of treatment is to help you breathe normally again. What are the causes? The most common cause of sleep apnea is a collapsed or blocked airway. There are three kinds of sleep apnea: Obstructive sleep apnea. This kind is caused by a blocked or collapsed airway. Central sleep apnea. This kind happens when the part of the brain that controls breathing does not send the correct signals to the muscles that control breathing. Mixed sleep apnea. This is a combination of obstructive and central sleep apnea. What increases the risk? You are more likely to develop this condition if you: Are overweight. Smoke. Have a smaller than normal airway. Are older. Are male. Drink alcohol. Take sedatives or tranquilizers. Have a family history of sleep apnea. Have a tongue or tonsils that are larger than normal. What are the signs or symptoms? Symptoms of this condition include: Trouble staying asleep. Loud snoring. Morning headaches. Waking up gasping. Dry mouth or sore throat in the morning. Daytime sleepiness and tiredness. If you have daytime fatigue because of sleep apnea, you may be more likely to have: Trouble concentrating. Forgetfulness. Irritability or mood swings. Personality changes. Feelings of depression. Sexual dysfunction. This may include loss of interest if you are male, or erectile dysfunction if you are male. How is this diagnosed? This condition may be diagnosed  with: A medical history. A physical exam. A series of tests that are done while you are sleeping (sleep study). These tests are usually done in a sleep lab, but they may also be done at home. How is this treated? Treatment for this condition aims to restore normal breathing and to ease symptoms during sleep. It may involve managing health issues that can affect breathing, such as high blood pressure or obesity. Treatment may include: Sleeping on your side. Using a decongestant if you have nasal congestion. Avoiding the use of depressants, including alcohol, sedatives, and narcotics. Losing weight if you are overweight. Making changes to your diet. Quitting smoking. Using a device to open your airway while you sleep, such as: An oral appliance. This is a custom-made mouthpiece that shifts your lower jaw forward. A continuous positive airway pressure (CPAP) device. This device blows air through a mask when you breathe out (exhale). A nasal expiratory positive airway pressure (EPAP) device. This device has valves  that you put into each nostril. A bi-level positive airway pressure (BPAP) device. This device blows air through a mask when you breathe in (inhale) and breathe out (exhale). Having surgery if other treatments do not work. During surgery, excess tissue is removed to create a wider airway. Follow these instructions at home: Lifestyle Make any lifestyle changes that your health care provider recommends. Eat a healthy, well-balanced diet. Take steps to lose weight if you are overweight. Avoid using depressants, including alcohol, sedatives, and narcotics. Do not use any products that contain nicotine or tobacco. These products include cigarettes, chewing tobacco, and vaping devices, such as e-cigarettes. If you need help quitting, ask your health care provider. General instructions Take over-the-counter and prescription medicines only as told by your health care provider. If you were  given a device to open your airway while you sleep, use it only as told by your health care provider. If you are having surgery, make sure to tell your health care provider you have sleep apnea. You may need to bring your device with you. Keep all follow-up visits. This is important. Contact a health care provider if: The device that you received to open your airway during sleep is uncomfortable or does not seem to be working. Your symptoms do not improve. Your symptoms get worse. Get help right away if: You develop: Chest pain. Shortness of breath. Discomfort in your back, arms, or stomach. You have: Trouble speaking. Weakness on one side of your body. Drooping in your face. These symptoms may represent a serious problem that is an emergency. Do not wait to see if the symptoms will go away. Get medical help right away. Call your local emergency services (911 in the [place] Do not drive yourself to the hospital. Summary Sleep apnea is a condition in which breathing pauses or becomes shallow during sleep. The most common cause is a collapsed or blocked airway. The goal of treatment is to restore normal breathing and to ease symptoms during sleep. This information is not intended to replace advice given to you by your health care provider. Make sure you discuss any questions you have with your healthcare provider. Document Revised: 03/30/2020 Document Reviewed: 03/30/2020 Elsevier Patient Education  2022 Elsevier Inc.  Propranolol Extended-Release Capsules What is this medication? PROPRANOLOL (proe PRAN oh lole) is a beta blocker. It decreases the amount of work your heart has to do and helps your heart beat regularly. It treats highblood pressure and/or prevent chest pain (also called angina). This medicine may be used for other purposes; ask your health care provider orpharmacist if you have questions. COMMON BRAND NAME(S): Inderal LA, Inderal XL, InnoPran XL What should I tell my care  team before I take this medication? They need to know if you have any of these conditions: circulation problems, or blood vessel disease diabetes history of heart attack or heart disease, vasospastic angina kidney disease liver disease lung or breathing disease, like asthma or emphysema pheochromocytoma slow heart rate thyroid disease an unusual or allergic reaction to propranolol, other beta-blockers, medicines, foods, dyes, or preservatives pregnant or trying to get pregnant breast-feeding How should I use this medication? Take this drug by mouth. Take it as directed on the prescription label at the same time every day. Do not cut, crush or chew this drug. Swallow the capsules whole. You can take it with or without food. If it upsets your stomach, take itwith food. Keep taking it unless your health care provider tells you to stop. Talk  to your health care provider about the use of this drug in children.Special care may be needed. Overdosage: If you think you have taken too much of this medicine contact apoison control center or emergency room at once. NOTE: This medicine is only for you. Do not share this medicine with others. What if I miss a dose? If you miss a dose, take it as soon as you can. If it is almost time for yournext dose, take only that dose. Do not take double or extra doses. What may interact with this medication? Do not take this medicine with any of the following medications: feverfew phenothiazines like chlorpromazine, mesoridazine, prochlorperazine, thioridazine This medicine may also interact with the following medications: aluminum hydroxide gel antipyrine antiviral medicines for HIV or AIDS barbiturates like phenobarbital certain medicines for blood pressure, heart disease, irregular heart beat cimetidine ciprofloxacin diazepam fluconazole haloperidol isoniazid medicines for cholesterol like cholestyramine or colestipol medicines for mental  depression medicines for migraine headache like almotriptan, eletriptan, frovatriptan, naratriptan, rizatriptan, sumatriptan, zolmitriptan NSAIDs, medicines for pain and inflammation, like ibuprofen or naproxen phenytoin rifampin teniposide theophylline thyroid medicines tolbutamide warfarin zileuton This list may not describe all possible interactions. Give your health care provider a list of all the medicines, herbs, non-prescription drugs, or dietary supplements you use. Also tell them if you smoke, drink alcohol, or use illegaldrugs. Some items may interact with your medicine. What should I watch for while using this medication? Visit your doctor or health care professional for regular check ups. Contact your doctor right away if your symptoms worsen. Check your blood pressure and pulse rate regularly. Ask your health care professional what your bloodpressure and pulse rate should be, and when you should contact them. Do not stop taking this medicine suddenly. This could lead to seriousheart-related effects. You may get drowsy or dizzy. Do not drive, use machinery, or do anything that needs mental alertness until you know how this drug affects you. Do not stand or sit up quickly, especially if you are an older patient. This reduces the risk of dizzy or fainting spells. Alcohol can make you more drowsy and dizzy.Avoid alcoholic drinks. This medicine may increase blood sugar. Ask your healthcare provider if changesin diet or medicines are needed if you have diabetes. Do not treat yourself for coughs, colds, or pain while you are taking this medicine without asking your doctor or health care professional for advice.Some ingredients may increase your blood pressure. What side effects may I notice from receiving this medication? Side effects that you should report to your doctor or health care professionalas soon as possible: allergic reactions like skin rash, itching or hives, swelling of the  face, lips, or tongue breathing problems cold hands or feet difficulty sleeping, nightmares dry peeling skin hallucinations muscle cramps or weakness signs and symptoms of high blood sugar such as being more thirsty or hungry or having to urinate more than normal. You may also feel very tired or have blurry vision. slow heart rate swelling of the legs and ankles vomiting Side effects that usually do not require medical attention (report to yourdoctor or health care professional if they continue or are bothersome): change in sex drive or performance diarrhea dry sore eyes hair loss nausea weak or tired This list may not describe all possible side effects. Call your doctor for medical advice about side effects. You may report side effects to FDA at1-800-FDA-1088. Where should I keep my medication? Keep out of the reach of children and pets. Store at  room temperature between 15 and 30 degrees C (59 and 86 degrees F). Protect from light and moisture. Keep the container tightly closed. Avoid exposure to extreme heat. Do not freeze. Throw away any unused drug after theexpiration date. NOTE: This sheet is a summary. It may not cover all possible information. If you have questions about this medicine, talk to your doctor, pharmacist, orhealth care provider.  2022 Elsevier/Gold Standard (2018-11-29 16:23:26)

## 2020-11-29 ENCOUNTER — Other Ambulatory Visit: Payer: Self-pay | Admitting: Neurology

## 2020-11-29 LAB — TSH: TSH: 1.53 u[IU]/mL (ref 0.450–4.500)

## 2020-11-29 MED ORDER — ALPRAZOLAM 0.25 MG PO TABS: ORAL_TABLET | ORAL | 0 refills | Status: DC

## 2020-11-29 MED ORDER — PROPRANOLOL HCL ER 60 MG PO CP24: 60.0000 mg | ORAL_CAPSULE | Freq: Every day | ORAL | 3 refills | Status: DC

## 2020-11-29 NOTE — Addendum Note (Signed)
Addended by: Guy Begin on: 11/29/2020 04:26 PM   Modules accepted: Orders

## 2020-11-29 NOTE — Telephone Encounter (Signed)
Pt has called to check on the status of his ALPRAZolam (XANAX) 0.25 MG tablet and the medication for his migraines getting called into the [place] [facility], please call

## 2020-11-29 NOTE — Telephone Encounter (Signed)
Pt called and wanted prescriptions escribed to [facility].  Xanax and propranolol.  I called and cancelled at the [facility] for both as he wanted at the [place].

## 2020-11-30 ENCOUNTER — Encounter: Payer: Self-pay | Admitting: Allergy

## 2020-12-05 ENCOUNTER — Ambulatory Visit (INDEPENDENT_AMBULATORY_CARE_PROVIDER_SITE_OTHER): Payer: No Typology Code available for payment source | Admitting: *Deleted

## 2020-12-05 ENCOUNTER — Encounter: Payer: Self-pay | Admitting: Allergy

## 2020-12-05 DIAGNOSIS — J309 Allergic rhinitis, unspecified: Secondary | ICD-10-CM | POA: Diagnosis not present

## 2020-12-11 ENCOUNTER — Ambulatory Visit
Admission: RE | Admit: 2020-12-11 | Discharge: 2020-12-11 | Disposition: A | Discharge status: Home / Self Care | Payer: No Typology Code available for payment source | Source: Ambulatory Visit | Attending: Neurology | Admitting: Neurology

## 2020-12-11 DIAGNOSIS — R519 Headache, unspecified: Secondary | ICD-10-CM

## 2020-12-11 DIAGNOSIS — H93A9 Pulsatile tinnitus, unspecified ear: Secondary | ICD-10-CM

## 2020-12-11 DIAGNOSIS — R51 Headache with orthostatic component, not elsewhere classified: Secondary | ICD-10-CM

## 2020-12-11 MED ORDER — GADOBENATE DIMEGLUMINE 529 MG/ML IV SOLN
20.0000 mL | Freq: Once | INTRAVENOUS | Status: AC | PRN
  Administered 2020-12-11: 20 mL via INTRAVENOUS

## 2020-12-12 NOTE — Progress Notes (Deleted)
   [address] [place] [place] [address] Dept: [phone]  FOLLOW UP NOTE  Patient ID: Brandon Suarez, male    DOB: [birth year]  Age: 30 y.o. MRN: [id number] Date of Office Visit: 12/13/2020  Assessment  Chief Complaint: No chief complaint on file.  HPI Brandon Suarez    Drug Allergies:  No Known Allergies  Physical Exam: There were no vitals taken for this visit.   Physical Exam  Diagnostics:    Assessment and Plan: No diagnosis found.  No orders of the defined types were placed in this encounter.   There are no Patient Instructions on file for this visit.  No follow-ups on file.    Thank you for the opportunity to care for this patient.  Please do not hesitate to contact me with questions.  Thermon Leyland, FNP [facility]

## 2020-12-13 ENCOUNTER — Ambulatory Visit: Payer: No Typology Code available for payment source | Admitting: Family Medicine

## 2020-12-17 ENCOUNTER — Ambulatory Visit: Payer: No Typology Code available for payment source | Admitting: Allergy

## 2020-12-28 ENCOUNTER — Ambulatory Visit: Payer: No Typology Code available for payment source | Admitting: Allergy

## 2021-01-08 ENCOUNTER — Ambulatory Visit (INDEPENDENT_AMBULATORY_CARE_PROVIDER_SITE_OTHER): Payer: No Typology Code available for payment source

## 2021-01-08 DIAGNOSIS — J309 Allergic rhinitis, unspecified: Secondary | ICD-10-CM | POA: Diagnosis not present

## 2021-01-30 ENCOUNTER — Ambulatory Visit (INDEPENDENT_AMBULATORY_CARE_PROVIDER_SITE_OTHER): Payer: No Typology Code available for payment source | Admitting: *Deleted

## 2021-01-30 ENCOUNTER — Other Ambulatory Visit: Payer: Self-pay | Admitting: Neurology

## 2021-01-30 ENCOUNTER — Encounter: Payer: Self-pay | Admitting: Allergy

## 2021-01-30 DIAGNOSIS — J309 Allergic rhinitis, unspecified: Secondary | ICD-10-CM

## 2021-01-30 MED ORDER — PROPRANOLOL HCL ER 120 MG PO CP24: 120.0000 mg | ORAL_CAPSULE | Freq: Every day | ORAL | 6 refills | Status: DC

## 2021-01-31 MED ORDER — PROPRANOLOL HCL ER 120 MG PO CP24: 120.0000 mg | ORAL_CAPSULE | Freq: Every day | ORAL | 6 refills | Status: AC

## 2021-02-07 ENCOUNTER — Encounter: Payer: Self-pay | Admitting: Allergy & Immunology

## 2021-02-07 ENCOUNTER — Ambulatory Visit (INDEPENDENT_AMBULATORY_CARE_PROVIDER_SITE_OTHER): Payer: No Typology Code available for payment source | Admitting: *Deleted

## 2021-02-07 DIAGNOSIS — J309 Allergic rhinitis, unspecified: Secondary | ICD-10-CM

## 2021-02-11 ENCOUNTER — Encounter: Payer: Self-pay | Admitting: Allergy

## 2021-02-11 ENCOUNTER — Ambulatory Visit (INDEPENDENT_AMBULATORY_CARE_PROVIDER_SITE_OTHER): Payer: No Typology Code available for payment source

## 2021-02-11 DIAGNOSIS — J309 Allergic rhinitis, unspecified: Secondary | ICD-10-CM | POA: Diagnosis not present

## 2021-02-19 ENCOUNTER — Encounter: Payer: Self-pay | Admitting: Allergy & Immunology

## 2021-02-19 ENCOUNTER — Ambulatory Visit (INDEPENDENT_AMBULATORY_CARE_PROVIDER_SITE_OTHER): Payer: No Typology Code available for payment source

## 2021-02-19 DIAGNOSIS — J309 Allergic rhinitis, unspecified: Secondary | ICD-10-CM

## 2021-02-25 NOTE — Progress Notes (Signed)
FOLLOW UP Date of Service/Encounter:  02/27/21   Subjective:  Brandon Suarez (DOB: 05-05-1991) is a 30 y.o. male [facility] in re-evaluation of the following: allergic rhinitis and eczema History obtained from: chart review and patient.  For Review, LV was on 08/31/20  with Dr. Selena Batten seen for seasonal and perennial allergic rhinitis.  Not controlled at last visit requiring prednisone taper.  He was started on SCIT which was started on 09/21/20.   He continues on weekly injections, in yellow vial (1:10,000). He is due for allergy injection today.  Previous Diagnostics:  -2021 skin testing was positive to grass, ragweed, weeds, trees, molds, cat, cockroach and dust mites. SCIT started 09/21/20  Today reports for follow-up. + coughing, congestion and runny nose and itchy eyes with increased throat clearing.  He is taking cetirizine daily and Pataday as needed.  He also uses Flonase and Azelastine every night and is getting some relief from this.  This is a typical pattern for him during weather changes.  Feels cough is definitely coming from his throat.  He always has a lot of mucus and drainage.  Feels his throat is dry and is staying hydrated but this is causing him to cough. He has OSA and uses nasal CPAP. He hasn't started to see much improvement from his allergy shots yet, but is hopeful.  Allergies as of 02/27/2021   No Known Allergies      Medication List        Accurate as of February 27, 2021 12:48 PM. If you have any questions, ask your nurse or doctor.          ALPRAZolam 0.25 MG tablet Commonly known as: Xanax Take 1-2 tabs (0.25mg -0.50mg ) 30-60 minutes before procedure. May repeat if needed.Do not drive.   azelastine 0.1 % nasal spray Commonly known as: ASTELIN Place 1-2 sprays into both nostrils 2 (two) times daily as needed (runny nose). Use in each nostril as directed   benzonatate 100 MG capsule Commonly known  as: Tessalon Perles Take 1 capsule (100 mg total) by mouth 3 (three) times daily as needed for cough. Started by: Tonny Bollman, MD   benzonatate 100 MG capsule Commonly known as: Tessalon Perles Take 1 capsule (100 mg total) by mouth 3 (three) times daily as needed for cough. Started by: Tonny Bollman, MD   cetirizine 10 MG tablet Commonly known as: ZYRTEC Take by mouth.   clotrimazole-betamethasone cream Commonly known as: Lotrisone Apply 1 application topically 2 (two) times daily.   EPINEPHrine 0.3 mg/0.3 mL Soaj injection Commonly known as: EPI-PEN Inject 0.3 mg into the muscle as needed.   fluticasone 50 MCG/ACT nasal spray Commonly known as: Flonase Place 1 spray into both nostrils 2 (two) times daily as needed (nasal congestion).   ibuprofen 800 MG tablet Commonly known as: ADVIL TAKE ONE TABLET BY MOUTH TWICE A DAY AS NEEDED (TAKE WITH FOOD)   ipratropium 0.03 % nasal spray Commonly known as: ATROVENT Place 2 sprays into both nostrils 3 (three) times daily. Started by: Tonny Bollman, MD   ipratropium 0.03 % nasal spray Commonly known as: ATROVENT Place 2 sprays into both nostrils 3 (three) times daily. Started by: Tonny Bollman, MD   melatonin 3 MG Tabs tablet Take 6 mg by mouth at bedtime.   metaxalone 800 MG tablet Commonly known as: SKELAXIN metaxalone 800 mg oral tablet Start Date: 06/03/19 Status: Ordered   montelukast 10 MG tablet Commonly known as:  Singulair Take 1 tablet (10 mg total) by mouth at bedtime.   naproxen 500 MG tablet Commonly known as: NAPROSYN   olopatadine 0.1 % ophthalmic solution Commonly known as: Patanol Place 1 drop into both eyes 2 (two) times daily as needed (itchy/watery).   pantoprazole 20 MG tablet Commonly known as: PROTONIX   propranolol ER 120 MG 24 hr capsule Commonly known as: INDERAL LA Take 1 capsule (120 mg total) by mouth at bedtime.   valACYclovir 1000 MG tablet Commonly known as: VALTREX 1,000 mg daily.  Prn for cold sores.       Past Medical History:  Diagnosis Date   Allergy    Headache    History reviewed. No pertinent surgical history. Otherwise, there have been no changes to his past medical history, surgical history, family history, or social history.  ROS: All others negative except as noted per HPI.   Objective:  BP 126/84   Pulse 73   Temp 98.8 F (37.1 C)   Resp 18   Ht 5\' 6"  (1.676 m)   Wt 215 lb 6.4 oz (97.7 kg)   SpO2 97%   BMI 34.77 kg/m  Body mass index is 34.77 kg/m. Physical Exam: General Appearance:  Alert, cooperative, no distress, appears stated age  Head:  Normocephalic, without obvious abnormality, atraumatic  Eyes:  Conjunctiva clear, EOM's intact  Nose: Nares normal, hypertrophic turbinates, clear rhinorrhea  Throat: Lips, tongue normal; teeth and gums normal, normal posterior oropharynx, throat clearing and coughing heard throughout encounter  Neck: Supple, symmetrical  Lungs:   CTAB, Respirations unlabored, no coughing  Heart:  RRR, no murmur, Appears well perfused  Extremities: No edema  Skin: Skin color, texture, turgor normal, no rashes or lesions on visualized portions of skin  Neurologic: No gross deficits   Assessment/Plan   Patient Instructions  Environmental allergies Continue environmental control measures directed at pollens, molds, cockroaches, dust mites May use over the counter antihistamines such as Zyrtec (cetirizine), Claritin (loratadine), Allegra (fexofenadine), or Xyzal (levocetirizine) daily as needed. May take twice a day if needed.  Restart Singulair (montelukast) 10mg  daily at night. Continue Flonase (fluticasone) nasal spray 1 spray per nostril twice a day as needed for nasal congestion.  May use azelastine nasal spray 1-2 sprays per nostril twice a day as needed for runny nose/drainage. Nasal saline spray (i.e., Simply Saline) or nasal saline lavage (i.e., NeilMed) is recommended as needed and prior to medicated  nasal spray-this will help keep your nasal passages moist May use olopatadine eye drops 0.1% twice a day as needed for itchy/watery eyes. Add Atrovent for post nasal drainage and can use 1-2 sprays up to three times per day as needed. Stay hydrated-aim to drink 7 (16 oz) water bottles per day Continue allergy shots. Practice tips below to reduce throat clearing which will in turn help with coughing  Follow up in 4 months or sooner if needed.  , MD  [facility]

## 2021-02-27 ENCOUNTER — Other Ambulatory Visit: Payer: Self-pay

## 2021-02-27 ENCOUNTER — Ambulatory Visit (INDEPENDENT_AMBULATORY_CARE_PROVIDER_SITE_OTHER): Payer: No Typology Code available for payment source | Admitting: Internal Medicine

## 2021-02-27 ENCOUNTER — Encounter: Payer: Self-pay | Admitting: Internal Medicine

## 2021-02-27 ENCOUNTER — Ambulatory Visit: Payer: Self-pay

## 2021-02-27 VITALS — BP 126/84 | HR 73 | Temp 98.8°F | Resp 18 | Ht 66.0 in | Wt 215.4 lb

## 2021-02-27 DIAGNOSIS — J309 Allergic rhinitis, unspecified: Secondary | ICD-10-CM

## 2021-02-27 DIAGNOSIS — H1010 Acute atopic conjunctivitis, unspecified eye: Secondary | ICD-10-CM | POA: Diagnosis not present

## 2021-02-27 DIAGNOSIS — J302 Other seasonal allergic rhinitis: Secondary | ICD-10-CM | POA: Diagnosis not present

## 2021-02-27 DIAGNOSIS — J3089 Other allergic rhinitis: Secondary | ICD-10-CM

## 2021-02-27 MED ORDER — BENZONATATE 100 MG PO CAPS: 100.0000 mg | ORAL_CAPSULE | Freq: Three times a day (TID) | ORAL | 0 refills | Status: DC | PRN

## 2021-02-27 MED ORDER — IPRATROPIUM BROMIDE 0.03 % NA SOLN: 2.0000 | Freq: Three times a day (TID) | NASAL | 12 refills | Status: DC

## 2021-02-27 NOTE — Patient Instructions (Addendum)
Environmental allergies Continue environmental control measures directed at pollens, molds, cockroaches, dust mites May use over the counter antihistamines such as Zyrtec (cetirizine), Claritin (loratadine), Allegra (fexofenadine), or Xyzal (levocetirizine) daily as needed. May take twice a day if needed.  Restart Singulair (montelukast) 10mg  daily at night. Continue Flonase (fluticasone) nasal spray 1 spray per nostril twice a day as needed for nasal congestion.  May use azelastine nasal spray 1-2 sprays per nostril twice a day as needed for runny nose/drainage. Nasal saline spray (i.e., Simply Saline) or nasal saline lavage (i.e., NeilMed) is recommended as needed and prior to medicated nasal spray-this will help keep your nasal passages moist May use olopatadine eye drops 0.1% twice a day as needed for itchy/watery eyes. Add Atrovent for post nasal drainage and can use 1-2 sprays up to three times per day as needed. Stay hydrated-aim to drink 7 (16 oz) water bottles per day Continue allergy shots. Practice tips below to reduce throat clearing which will in turn help with coughing  Follow up in 4 months or sooner if needed.  ------------------------------------------------------------------------------------------- CHRONIC THROAT CLEARING-WHAT TO DO!! Causes of chronic throat clearing may include the following (among others):  Acid reflux (lanyngopharyngeal reflux) Allergies (pollens, pet dander, dust mites, etc) Non allergic rhinitis (runny nose, post nasal drainage or congestion NOT caused by allergies-can be secondary to pollutants, cold air, changes in blood vessels to the nose as we age,etc) Environmental irritants (tobacco, smoke, air pollution) Asthma If present for a long period of time, throat clearing can become a habit. We will work with you to treat any of the medical reasons for throat clearing.  Your job is to help prevent the habit, which can cause damage (redness and  swelling) to your vocal cords.  It will require a conscious effort on your behalf.  Tips for prevention of throat clearing:  Instead of clearing your throat, swallow instead.   Carrying around water (or something to drink) will help you move the mucus in the right direction.  IF you have the urge to clear your throat, drink your water. If you absolutely have to clear your throat, use a non-traumatic exercise to do so.   Pant with your mouth open saying "[place], [place], [place]" with a powerful, breathy voice. Increase water intake.  This thins secretions, making them easier to swallow. Chew baking soda gum (ARM [employer] HAMMER) which can help with swallowing, reflux, and throat clearing.  Try to chew up to three times daily.   If you experience jaw pain or headaches, decrease the amount of chewing. Suck on sugar free hard candy to help with swallowing. Have your friends and family remind you to swallow when they hear you throat clearing.  As this can be habit forming, sometimes you may not realize you are doing this.  Having someone point it out to you, will help you become more conscious of the behavior. BE PATIENT.  This will take time to resolve, and some do not see improvement until 8-12 weeks into therapy/behavior modifications.

## 2021-03-06 ENCOUNTER — Encounter: Payer: Self-pay | Admitting: Allergy

## 2021-03-06 ENCOUNTER — Ambulatory Visit (INDEPENDENT_AMBULATORY_CARE_PROVIDER_SITE_OTHER): Payer: No Typology Code available for payment source

## 2021-03-06 DIAGNOSIS — J309 Allergic rhinitis, unspecified: Secondary | ICD-10-CM

## 2021-03-14 ENCOUNTER — Ambulatory Visit (INDEPENDENT_AMBULATORY_CARE_PROVIDER_SITE_OTHER): Payer: No Typology Code available for payment source

## 2021-03-14 ENCOUNTER — Encounter: Payer: Self-pay | Admitting: Internal Medicine

## 2021-03-14 DIAGNOSIS — J309 Allergic rhinitis, unspecified: Secondary | ICD-10-CM

## 2021-03-18 NOTE — Progress Notes (Signed)
Chief Complaint  Patient presents with   Follow-up    Rm 1, alone. Here for 3 month migraine f/u. Pt reports sx are a little worse. Could be due to cold weather.      HISTORY OF PRESENT ILLNESS:  03/20/21 ALL:  Brandon Suarez is a 30 y.o. male here today for follow up for headaches. He was started on propranolol at consult with Dr Lucia Gaskins 11/2020. Dose was increased to 120mg  daily 01/30/21. MRI/MRA normal. He is not sure headaches are any better. He has tolerated propranolol but continues to have hear daily headaches. He has 4-8 migrainous headaches a month.   He is followed by the [facility] for OSA. He is treated with a nasal device. He is not sure this has helped. He does not sleep well. He usually goes to bed at 12-1am and up at 4-5am. He has difficulty falling asleep. He takes melatonin but unsure of dose. He reports getting an rx through the [facility]. Epic noted 6mg  daily. He continues immunotherapy for allergies.   HISTORY (copied from Dr [place] previous note)  HPI:  Brandon Suarez is a 30 y.o. male here as requested by Carlus Pavlov, MD for daily headaches. PMHx sleep apnea, insomnia, depression, arthritis. He is not using his cpap. The headaches "come and go", whenever, he can be at work or sitting on the couch watching TV, he can wake with them in the mornings, mouth is dry in the mornings, He is not sure if his sleep apnea is mild. He has constant throbbing, he can hear the throbbing, it is on both sides, sometimes in the temples, sometimes in the backer part of the back of the head, doesn't last an hour, maybe 20 minutes, a cool area helps, noise makes it worse, bright lights make it worse, ibuprofen may help a little. And ice pak helps. No nausea or vomiting. No vision changes. No other focal neurologic symptoms. They get worse bendingstooping and working out. Pulsatile tinnitus. Started a few months ago, no inciting events, no head trauma, no new medications.    Reviewed notes, labs and  imaging from outside physicians, which showed:   10/07/2020: Cbc/cmp: unremarkable    Medications tried that can be used in migraine management include: Flexeril, ibuprofen, melatonin, meloxicam, naproxen, prednisone.   REVIEW OF SYSTEMS: Out of a complete 14 system review of symptoms, the patient complains only of the following symptoms, headaches, insomnia and all other reviewed systems are negative.   ALLERGIES: No Known Allergies   HOME MEDICATIONS: Outpatient Medications Prior to Visit  Medication Sig Dispense Refill   azelastine (ASTELIN) 0.1 % nasal spray Place 1-2 sprays into both nostrils 2 (two) times daily as needed (runny nose). Use in each nostril as directed 30 mL 5   cetirizine (ZYRTEC) 10 MG tablet Take by mouth.     clotrimazole-betamethasone (LOTRISONE) cream Apply 1 application topically 2 (two) times daily. 30 g 3   EPINEPHrine 0.3 mg/0.3 mL IJ SOAJ injection Inject 0.3 mg into the muscle as needed. 1 each 2   fluticasone (FLONASE) 50 MCG/ACT nasal spray Place 1 spray into both nostrils 2 (two) times daily as needed (nasal congestion). 16 g 5   ibuprofen (ADVIL) 800 MG tablet TAKE ONE TABLET BY MOUTH TWICE A DAY AS NEEDED (TAKE WITH FOOD)     ipratropium (ATROVENT) 0.03 % nasal spray Place 2 sprays into both nostrils 3 (three) times daily. 30 mL 12   ipratropium (ATROVENT) 0.03 % nasal spray Place 2  sprays into both nostrils 3 (three) times daily. 30 mL 12   melatonin 3 MG TABS tablet Take 6 mg by mouth at bedtime.     metaxalone (SKELAXIN) 800 MG tablet metaxalone 800 mg oral tablet Start Date: 06/03/19 Status: Ordered     montelukast (SINGULAIR) 10 MG tablet Take 1 tablet (10 mg total) by mouth at bedtime. 30 tablet 5   naproxen (NAPROSYN) 500 MG tablet      olopatadine (PATANOL) 0.1 % ophthalmic solution Place 1 drop into both eyes 2 (two) times daily as needed (itchy/watery). 5 mL 5   pantoprazole (PROTONIX) 20 MG tablet      propranolol ER (INDERAL LA) 120 MG  24 hr capsule Take 1 capsule (120 mg total) by mouth at bedtime. 30 capsule 6   valACYclovir (VALTREX) 1000 MG tablet 1,000 mg daily. Prn for cold sores.     ALPRAZolam (XANAX) 0.25 MG tablet Take 1-2 tabs (0.25mg -0.50mg ) 30-60 minutes before procedure. May repeat if needed.Do not drive. (Patient not taking: Reported on 02/27/2021) 4 tablet 0   benzonatate (TESSALON PERLES) 100 MG capsule Take 1 capsule (100 mg total) by mouth 3 (three) times daily as needed for cough. 20 capsule 0   benzonatate (TESSALON PERLES) 100 MG capsule Take 1 capsule (100 mg total) by mouth 3 (three) times daily as needed for cough. 20 capsule 0   No facility-administered medications prior to visit.     PAST MEDICAL HISTORY: Past Medical History:  Diagnosis Date   Allergy    Headache      PAST SURGICAL HISTORY: History reviewed. No pertinent surgical history.   FAMILY HISTORY: Family History  Problem Relation Age of Onset   Hypertension Father    Migraines Neg Hx      SOCIAL HISTORY: Social History   Socioeconomic History   Marital status: Single    Spouse name: Not on file   Number of children: Not on file   Years of education: Not on file   Highest education level: Not on file  Occupational History   Not on file  Tobacco Use   Smoking status: Never   Smokeless tobacco: Never  Vaping Use   Vaping Use: Never used  Substance and Sexual Activity   Alcohol use: Yes    Comment: 2   Drug use: Never   Sexual activity: Yes  Other Topics Concern   Not on file  Social History Narrative   Caffeine none, soda's none.  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     Social Determinants of Health   Financial Resource Strain: Not on file  Food Insecurity: Not on file  Transportation Needs: Not on file  Physical Activity: Not on file  Stress: Not on file  Social Connections: Not on file  Intimate Partner Violence: Not on file     PHYSICAL EXAM  Vitals:   03/20/21 0732  BP: 122/75   Pulse: 72  Weight: 216 lb (98 kg)  Height: 5\' 6"  (1.676 m)   Body mass index is 34.86 kg/m.  Generalized: Well developed, in no acute distress  Cardiology: normal rate and rhythm, no murmur auscultated  Respiratory: clear to auscultation bilaterally    Neurological examination  Mentation: Alert oriented to time, place, history taking. Follows all commands speech and language fluent Cranial nerve II-XII: Pupils were equal round reactive to light. Extraocular movements were full, visual field were full on confrontational test. Facial sensation and strength were normal. Head turning and shoulder shrug  were  normal and symmetric. Motor: The motor testing reveals 5 over 5 strength of all 4 extremities. Good symmetric motor tone is noted throughout.  Coordination: Cerebellar testing reveals good finger-nose-finger and heel-to-shin bilaterally.  Gait and station: Gait is normal.    DIAGNOSTIC DATA (LABS, IMAGING, TESTING) - I reviewed patient records, labs, notes, testing and imaging myself where available.  Lab Results  Component Value Date   WBC 7.8 09/26/2015   HGB 15.9 09/26/2015   HCT 45.0 09/26/2015   MCV 83.4 09/26/2015   No results found for: NA, K, CL, CO2, GLUCOSE, BUN, CREATININE, CALCIUM, PROT, ALBUMIN, AST, ALT, ALKPHOS, BILITOT, GFRNONAA, GFRAA No results found for: CHOL, HDL, LDLCALC, LDLDIRECT, TRIG, CHOLHDL No results found for: AJOI7O No results found for: VITAMINB12 Lab Results  Component Value Date   TSH 1.530 11/27/2020    No flowsheet data found.   No flowsheet data found.   ASSESSMENT AND PLAN  30 y.o. year old male  has a past medical history of Allergy and Headache. here with    Chronic daily headache  Migraine without aura and without status migrainosus, not intractable  [place] continues to have hear daily headaches with about 4-8 migrainous days per month. He will continue propranolol 120mg  daily. I will add amitriptyline 10mg  daily. I am  hopeful this will help with sleep as well. Side effects reviewed and education material provided in AVS. May increase dose if well tolerated. He will continue ibuprofen for abortive therapy. Healthy lifestyle habits advised. I [facility] to ensure ap[facility] is well managed. He will return to see me in 6 months.   No orders of the defined types were placed in this encounter.    Meds ordered this encounter  Medications   amitriptyline (ELAVIL) 10 MG tablet    Sig: Take 1 tablet (10 mg total) by mouth at bedtime.    Dispense:  30 tablet    Refill:  5    Order Specific Question:   Supervising Provider    Answer:   February       Anson Fret, MSN, FNP-C 03/20/2021, 8:07 AM  [facility] [address], [address] [place], [address] [place] [phone]

## 2021-03-18 NOTE — Patient Instructions (Signed)
Below is our plan:  We will continue propranolol 120mg  daily. I am adding amitriptyline 10mg . Take 30 minutes before bedtime. Please continue ibuprofen as needed for abortive therapy. [facility]   Please make sure you are staying well hydrated. I recommend 50-60 ounces daily. Well balanced diet and regular exercise encouraged. Consistent sleep schedule with 6-8 hours recommended.   Please continue follow up with care team as directed.   Follow up with me in 6 months   You may receive a survey regarding today's visit. I encourage you to leave honest feed back as I do use this information to improve patient care. Thank you for seeing me today!   To prevent or relieve headaches, try the following:  Cool Compress. Lie down and place a cool compress on your head.  Avoid headache triggers. If certain foods or odors seem to have triggered your migraines in the past, avoid them. A headache diary might help you identify triggers.  Include physical activity in your daily routine. Try a daily walk or other moderate aerobic exercise.  Manage stress. Find healthy ways to cope with the stressors, such as delegating tasks on your to-do list.  Practice relaxation techniques. Try deep breathing, yoga, massage and visualization.  Eat regularly. Eating regularly scheduled meals and maintaining a healthy diet might help prevent headaches. Also, drink plenty of fluids.  Follow a regular sleep schedule. Sleep deprivation might contribute to headaches Consider biofeedback. With this mind-body technique, you learn to control certain bodily functions -- such as muscle tension, heart rate and blood pressure -- to prevent headaches or reduce headache pain.      Proceed to emergency room if you experience new or worsening symptoms or symptoms do not resolve, if you have new neurologic symptoms or if headache is severe, or for any concerning symptom.

## 2021-03-19 ENCOUNTER — Ambulatory Visit (INDEPENDENT_AMBULATORY_CARE_PROVIDER_SITE_OTHER): Payer: No Typology Code available for payment source | Admitting: *Deleted

## 2021-03-19 ENCOUNTER — Encounter: Payer: Self-pay | Admitting: Allergy & Immunology

## 2021-03-19 DIAGNOSIS — J309 Allergic rhinitis, unspecified: Secondary | ICD-10-CM

## 2021-03-20 ENCOUNTER — Ambulatory Visit (INDEPENDENT_AMBULATORY_CARE_PROVIDER_SITE_OTHER): Payer: No Typology Code available for payment source | Admitting: Family Medicine

## 2021-03-20 ENCOUNTER — Encounter: Payer: Self-pay | Admitting: Family Medicine

## 2021-03-20 VITALS — BP 122/75 | HR 72 | Ht 66.0 in | Wt 216.0 lb

## 2021-03-20 DIAGNOSIS — R519 Headache, unspecified: Secondary | ICD-10-CM | POA: Diagnosis not present

## 2021-03-20 DIAGNOSIS — G43009 Migraine without aura, not intractable, without status migrainosus: Secondary | ICD-10-CM | POA: Diagnosis not present

## 2021-03-20 MED ORDER — AMITRIPTYLINE HCL 10 MG PO TABS: 10.0000 mg | ORAL_TABLET | Freq: Every day | ORAL | 5 refills | Status: DC

## 2021-03-25 ENCOUNTER — Ambulatory Visit (INDEPENDENT_AMBULATORY_CARE_PROVIDER_SITE_OTHER): Payer: No Typology Code available for payment source | Admitting: *Deleted

## 2021-03-25 ENCOUNTER — Encounter: Payer: Self-pay | Admitting: Allergy

## 2021-03-25 DIAGNOSIS — J309 Allergic rhinitis, unspecified: Secondary | ICD-10-CM

## 2021-04-02 ENCOUNTER — Ambulatory Visit (INDEPENDENT_AMBULATORY_CARE_PROVIDER_SITE_OTHER): Payer: No Typology Code available for payment source | Admitting: *Deleted

## 2021-04-02 DIAGNOSIS — J309 Allergic rhinitis, unspecified: Secondary | ICD-10-CM | POA: Diagnosis not present

## 2021-04-10 ENCOUNTER — Encounter: Payer: Self-pay | Admitting: Allergy

## 2021-04-10 ENCOUNTER — Ambulatory Visit (INDEPENDENT_AMBULATORY_CARE_PROVIDER_SITE_OTHER): Payer: No Typology Code available for payment source

## 2021-04-10 DIAGNOSIS — J309 Allergic rhinitis, unspecified: Secondary | ICD-10-CM

## 2021-04-23 ENCOUNTER — Ambulatory Visit (INDEPENDENT_AMBULATORY_CARE_PROVIDER_SITE_OTHER): Payer: No Typology Code available for payment source | Admitting: *Deleted

## 2021-04-23 ENCOUNTER — Encounter: Payer: Self-pay | Admitting: Allergy & Immunology

## 2021-04-23 DIAGNOSIS — J309 Allergic rhinitis, unspecified: Secondary | ICD-10-CM

## 2021-05-07 ENCOUNTER — Ambulatory Visit (INDEPENDENT_AMBULATORY_CARE_PROVIDER_SITE_OTHER): Payer: No Typology Code available for payment source | Admitting: *Deleted

## 2021-05-07 ENCOUNTER — Encounter: Payer: Self-pay | Admitting: Allergy and Immunology

## 2021-05-07 DIAGNOSIS — J309 Allergic rhinitis, unspecified: Secondary | ICD-10-CM | POA: Diagnosis not present

## 2021-05-14 ENCOUNTER — Ambulatory Visit (INDEPENDENT_AMBULATORY_CARE_PROVIDER_SITE_OTHER): Payer: No Typology Code available for payment source | Admitting: *Deleted

## 2021-05-14 ENCOUNTER — Encounter: Payer: Self-pay | Admitting: Allergy & Immunology

## 2021-05-14 DIAGNOSIS — J309 Allergic rhinitis, unspecified: Secondary | ICD-10-CM | POA: Diagnosis not present

## 2021-05-22 ENCOUNTER — Ambulatory Visit (INDEPENDENT_AMBULATORY_CARE_PROVIDER_SITE_OTHER): Payer: No Typology Code available for payment source

## 2021-05-22 ENCOUNTER — Telehealth: Payer: Self-pay

## 2021-05-22 ENCOUNTER — Encounter: Payer: Self-pay | Admitting: Allergy

## 2021-05-22 DIAGNOSIS — J309 Allergic rhinitis, unspecified: Secondary | ICD-10-CM | POA: Diagnosis not present

## 2021-05-22 NOTE — Telephone Encounter (Signed)
Can you find out which? I didn't make a note in the chart.   Usually I give out Vanicream which I don't think will be covered by the [facility] (or any insurance).

## 2021-05-22 NOTE — Telephone Encounter (Signed)
Patient came in for allergy injections and stated he had samples of eczema cream that worked so he wants a script for something. Send to the [place] in [place]

## 2021-05-23 NOTE — Telephone Encounter (Signed)
I called patient and he didn't know the name of it because he threw all the samples away after he used them.

## 2021-05-23 NOTE — Telephone Encounter (Signed)
I guess give VaniCream?!?   I don't know what else I would have given as a sample except maybe Eucrisa.  I haven't given out any of the newer ones.

## 2021-05-24 MED ORDER — VANICREAM EX LOTN: 1.0000 "application " | TOPICAL_LOTION | Freq: Every day | CUTANEOUS | 5 refills | Status: DC | PRN

## 2021-05-24 MED ORDER — VANICREAM EX OINT: 1.0000 "application " | TOPICAL_OINTMENT | Freq: Every day | CUTANEOUS | 5 refills | Status: AC | PRN

## 2021-05-24 NOTE — Addendum Note (Signed)
Addended by: Isabel Caprice on: 05/24/2021 08:45 AM   Modules accepted: Orders

## 2021-05-24 NOTE — Telephone Encounter (Signed)
Pt called back and I relayed Lachelle's message to him.

## 2021-05-24 NOTE — Telephone Encounter (Signed)
Put venicream into the [facility], I called and informed Sergi the [place] may not cover it and if not he can come into the office and pick up some more samples and then purchase it over the counter.

## 2021-05-29 ENCOUNTER — Telehealth: Payer: Self-pay

## 2021-05-29 ENCOUNTER — Encounter: Payer: Self-pay | Admitting: Allergy

## 2021-05-29 ENCOUNTER — Ambulatory Visit (INDEPENDENT_AMBULATORY_CARE_PROVIDER_SITE_OTHER): Payer: No Typology Code available for payment source

## 2021-05-29 DIAGNOSIS — J309 Allergic rhinitis, unspecified: Secondary | ICD-10-CM | POA: Diagnosis not present

## 2021-05-29 MED ORDER — EUCERIN EX CREA: TOPICAL_CREAM | CUTANEOUS | 0 refills | Status: DC | PRN

## 2021-05-29 NOTE — Telephone Encounter (Signed)
[facility] will not [facility] the vanicream oint or lotion but will cover cream, eucerin cream or lanolih mineral oil please advise to change

## 2021-05-29 NOTE — Addendum Note (Signed)
Addended by: Berna Bue on: 05/29/2021 04:45 PM   Modules accepted: Orders

## 2021-05-29 NOTE — Telephone Encounter (Signed)
Sent in eucerin cream for pt to [facility] and pt did come by for samples of vanicream and lachelle rma gave her some samples

## 2021-05-29 NOTE — Telephone Encounter (Signed)
Vanicream cream? If not, eucerin cream is fine.  He just like the Vanicream and asked for a prescription.  We also told him to come get samples if he wants, or he can go buy it over the counter.

## 2021-06-07 ENCOUNTER — Encounter: Payer: Self-pay | Admitting: Allergy

## 2021-06-07 ENCOUNTER — Ambulatory Visit (INDEPENDENT_AMBULATORY_CARE_PROVIDER_SITE_OTHER): Payer: No Typology Code available for payment source

## 2021-06-07 DIAGNOSIS — J309 Allergic rhinitis, unspecified: Secondary | ICD-10-CM

## 2021-06-12 ENCOUNTER — Encounter: Payer: Self-pay | Admitting: Allergy

## 2021-06-12 ENCOUNTER — Telehealth: Payer: Self-pay | Admitting: *Deleted

## 2021-06-12 ENCOUNTER — Ambulatory Visit (INDEPENDENT_AMBULATORY_CARE_PROVIDER_SITE_OTHER): Payer: No Typology Code available for payment source | Admitting: *Deleted

## 2021-06-12 DIAGNOSIS — J309 Allergic rhinitis, unspecified: Secondary | ICD-10-CM | POA: Diagnosis not present

## 2021-06-12 NOTE — Telephone Encounter (Signed)
Pt called, he got his allergy shots earlier today and he states he has broken out with some hives on his neck. No reaction on injection site. He took Careers adviser earlier today. I informed him he may take another allegra or benadryl this afternoon and can purchase hydrocortisone cream OTC. He will call us back if it gets worse.

## 2021-06-17 ENCOUNTER — Ambulatory Visit (INDEPENDENT_AMBULATORY_CARE_PROVIDER_SITE_OTHER): Payer: No Typology Code available for payment source

## 2021-06-17 ENCOUNTER — Telehealth: Payer: Self-pay | Admitting: Allergy

## 2021-06-17 ENCOUNTER — Encounter: Payer: Self-pay | Admitting: Allergy

## 2021-06-17 DIAGNOSIS — J309 Allergic rhinitis, unspecified: Secondary | ICD-10-CM | POA: Diagnosis not present

## 2021-06-18 NOTE — Telephone Encounter (Signed)
Error

## 2021-06-24 ENCOUNTER — Ambulatory Visit (INDEPENDENT_AMBULATORY_CARE_PROVIDER_SITE_OTHER): Payer: No Typology Code available for payment source

## 2021-06-24 ENCOUNTER — Encounter: Payer: Self-pay | Admitting: Internal Medicine

## 2021-06-24 DIAGNOSIS — J309 Allergic rhinitis, unspecified: Secondary | ICD-10-CM | POA: Diagnosis not present

## 2021-07-02 NOTE — Progress Notes (Signed)
? ?FOLLOW UP ?Date of Service/Encounter:  07/03/21 ? ? ?Subjective:  ?Brandon Suarez (DOB: 1990-06-29) is a 31 y.o. male with PMHx of OSA on CPAP who returns to the [facility] on 07/03/2021 in re-evaluation of the following: Seasonal and perennial allergic rhinitis and conjunctivitis, dry skin ?History obtained from: chart review and patient. ? ?For Review, LV was on 02/27/21  with Dr.Bayan Hedstrom.  doing well on immunotherapy at last visit, restarted on singulair and added atrovent due to increased upper airway/rhinitis symptoms.   ? ?Previous Diagnostics:  ?-2021 skin testing was positive to grass, ragweed, weeds, trees, molds, cat, cockroach and dust mites. ?-SCIT started 09/21/20 ? ?Today presents for follow-up. ?He continues to have some nasal stuffiness and congestion.  Sneezing less.  Eyes still get watery.   ?He is taking zyrtec 10 mg daily.  Occasionally takes an Allegra on days when he has allergy injections and feels more itchy. ?He is no longer taking singulair (montelukast) because he ran out.  He is not sure whether he needs to be taking this with the Zyrtec because he believed these were the same medications ?He has been tolerating his injections well.  Since starting the red vial, he did have a large local reaction on his first injection in this vial, but he has not had 1 since. ?He feels like his throat irritation and throat clearing has decreased since his last visit.  He has noticed that he is able to smell trees better this year than he has in former years. ? ?Allergies as of 07/03/2021   ?No Known Allergies ?  ? ?  ?Medication List  ?  ? ?  ? Accurate as of July 03, 2021  4:47 PM. If you have any questions, ask your nurse or doctor.  ?  ?  ? ?  ? ?STOP taking these medications   ? ?ipratropium 0.03 % nasal spray ?Commonly known as: ATROVENT ?Stopped by: Sigurd Sos, MD ?  ? ?  ? ?TAKE these medications   ? ?amitriptyline 10 MG tablet ?Commonly known as: ELAVIL ?Take 1 tablet (10 mg total)  by mouth at bedtime. ?  ?azelastine 0.1 % nasal spray ?Commonly known as: ASTELIN ?Place 1-2 sprays into both nostrils 2 (two) times daily as needed (NASAL CONGESTION STUFFINESS). Use in each nostril as directed ?What changed: reasons to take this ?Changed by: Sigurd Sos, MD ?  ?cetirizine 10 MG tablet ?Commonly known as: ZYRTEC ?Take by mouth. ?  ?clotrimazole-betamethasone cream ?Commonly known as: Lotrisone ?Apply 1 application topically 2 (two) times daily. ?  ?EPINEPHrine 0.3 mg/0.3 mL Soaj injection ?Commonly known as: EPI-PEN ?Inject 0.3 mg into the muscle as needed. ?  ?eucerin cream ?Apply topically as needed for dry skin. ?  ?fexofenadine 180 MG tablet ?Commonly known as: Allegra Allergy ?Take 1 tablet (180 mg total) by mouth daily. ?Started by: Sigurd Sos, MD ?  ?fluticasone 50 MCG/ACT nasal spray ?Commonly known as: Flonase ?Place 1 spray into both nostrils 2 (two) times daily as needed (nasal congestion). ?  ?ibuprofen 800 MG tablet ?Commonly known as: ADVIL ?TAKE ONE TABLET BY MOUTH TWICE A DAY AS NEEDED (TAKE WITH FOOD) ?  ?melatonin 3 MG Tabs tablet ?Take 6 mg by mouth at bedtime. ?  ?metaxalone 800 MG tablet ?Commonly known as: SKELAXIN ?metaxalone 800 mg oral tablet ?Start Date: 06/03/19 ?Status: Ordered ?  ?montelukast 10 MG tablet ?Commonly known as: Singulair ?Take 1 tablet (10 mg total) by mouth at bedtime. TAKE IN ADDITION  TO ZYRTEC (CETIRIZINE) ?What changed: additional instructions ?Changed by: Sigurd Sos, MD ?  ?naproxen 500 MG tablet ?Commonly known as: NAPROSYN ?  ?olopatadine 0.1 % ophthalmic solution ?Commonly known as: Patanol ?Place 1 drop into both eyes 2 (two) times daily as needed (itchy/watery). ?  ?pantoprazole 20 MG tablet ?Commonly known as: PROTONIX ?  ?propranolol ER 120 MG 24 hr capsule ?Commonly known as: INDERAL LA ?Take 1 capsule (120 mg total) by mouth at bedtime. ?  ?valACYclovir 1000 MG tablet ?Commonly known as: VALTREX ?1,000 mg daily. Prn for cold sores. ?   ?Vanicream Lotn ?Apply 1 application topically daily as needed. ?  ?Vanicream Oint ?Apply 1 application topically daily as needed. ?  ? ?  ? ?Past Medical History:  ?Diagnosis Date  ? Allergy   ? Headache   ? ?History reviewed. No pertinent surgical history. ?Otherwise, there have been no changes to his past medical history, surgical history, family history, or social history. ? ?ROS: All others negative except as noted per HPI.  ? ?Objective:  ?BP 124/80   Pulse (!) 104   Temp 98.4 ?F (36.9 ?C)   Resp 18   Ht 5\' 6"  (1.676 m)   Wt 221 lb 2 oz (100.3 kg)   SpO2 99%   BMI 35.69 kg/m?  ?Body mass index is 35.69 kg/m?Marland Kitchen ?Physical Exam: ?General Appearance:  Alert, cooperative, no distress, appears stated age  ?Head:  Normocephalic, without obvious abnormality, atraumatic  ?Eyes:  Conjunctiva clear, EOM's intact  ?Nose: Nares normal, hypertrophic turbinates, normal mucosa, no visible anterior polyps, and septum midline  ?Throat: Lips, tongue normal; teeth and gums normal, normal posterior oropharynx  ?Neck: Supple, symmetrical  ?Lungs:   clear to auscultation bilaterally, Respirations unlabored, no coughing  ?Heart:  regular rate and rhythm and no murmur, Appears well perfused  ?Extremities: No edema  ?Skin: Skin color, texture, turgor normal, no rashes or lesions on visualized portions of skin  ?Neurologic: No gross deficits  ? ?Assessment/Plan  ? ?Environmental allergies- ?Continue environmental control measures directed at pollens, molds, cockroaches, dust mites ?May use over the counter antihistamines such as Zyrtec (cetirizine), Claritin (loratadine), Allegra (fexofenadine), or Xyzal (levocetirizine) daily as needed. May take twice a day if needed.  ?Continue Singulair (montelukast) 10mg  daily at night.  This works different than the antihistamines (zyrtec and allegra) ?Failed flonase, atrovent nasal sprays.   ?Nasal saline spray (i.e., Simply Saline) or nasal saline lavage (i.e., NeilMed) is recommended as  needed  ?May use olopatadine eye drops 0.1% twice a day as needed for itchy/watery eyes. ?Continue allergy shots as scheduled. ? ? ?Dry Skin:  ?- continue Eucerin lotion as needed; refills provided  ? ?Follow up in 6 months or sooner if needed.  ?It was a pleasure seeing you again today!  ? ?Sigurd Sos, MD  ?[facility] ? ? ? ? ? ? ?

## 2021-07-03 ENCOUNTER — Ambulatory Visit (INDEPENDENT_AMBULATORY_CARE_PROVIDER_SITE_OTHER): Payer: No Typology Code available for payment source | Admitting: Internal Medicine

## 2021-07-03 ENCOUNTER — Encounter: Payer: Self-pay | Admitting: Internal Medicine

## 2021-07-03 ENCOUNTER — Ambulatory Visit: Payer: Self-pay

## 2021-07-03 ENCOUNTER — Other Ambulatory Visit: Payer: Self-pay

## 2021-07-03 VITALS — BP 124/80 | HR 104 | Temp 98.4°F | Resp 18 | Ht 66.0 in | Wt 221.1 lb

## 2021-07-03 DIAGNOSIS — J302 Other seasonal allergic rhinitis: Secondary | ICD-10-CM

## 2021-07-03 DIAGNOSIS — L853 Xerosis cutis: Secondary | ICD-10-CM | POA: Insufficient documentation

## 2021-07-03 DIAGNOSIS — J309 Allergic rhinitis, unspecified: Secondary | ICD-10-CM

## 2021-07-03 DIAGNOSIS — H1010 Acute atopic conjunctivitis, unspecified eye: Secondary | ICD-10-CM

## 2021-07-03 DIAGNOSIS — H1013 Acute atopic conjunctivitis, bilateral: Secondary | ICD-10-CM | POA: Diagnosis not present

## 2021-07-03 MED ORDER — EPINEPHRINE 0.3 MG/0.3ML IJ SOAJ: 0.3000 mg | INTRAMUSCULAR | 2 refills | Status: DC | PRN

## 2021-07-03 MED ORDER — EUCERIN EX CREA: TOPICAL_CREAM | CUTANEOUS | 5 refills | Status: AC | PRN

## 2021-07-03 MED ORDER — MONTELUKAST SODIUM 10 MG PO TABS: 10.0000 mg | ORAL_TABLET | Freq: Every day | ORAL | 5 refills | Status: DC

## 2021-07-03 MED ORDER — AZELASTINE HCL 0.1 % NA SOLN: 1.0000 | Freq: Two times a day (BID) | NASAL | 5 refills | Status: AC | PRN

## 2021-07-03 MED ORDER — FEXOFENADINE HCL 180 MG PO TABS: 180.0000 mg | ORAL_TABLET | Freq: Every day | ORAL | 5 refills | Status: DC

## 2021-07-03 NOTE — Patient Instructions (Addendum)
Environmental allergies- ?Continue environmental control measures directed at pollens, molds, cockroaches, dust mites ?May use over the counter antihistamines such as Zyrtec (cetirizine), Claritin (loratadine), Allegra (fexofenadine), or Xyzal (levocetirizine) daily as needed. May take twice a day if needed.  ?Continue Singulair (montelukast) 10mg  daily at night.  This works different than the antihistamines (zyrtec and allegra) ?Failed flonase, atrovent nasal sprays.   ?Nasal saline spray (i.e., Simply Saline) or nasal saline lavage (i.e., NeilMed) is recommended as needed  ?May use olopatadine eye drops 0.1% twice a day as needed for itchy/watery eyes. ?Continue allergy shots as scheduled. ? ? ?Dry Skin:  ?- continue Eucerin lotion as needed; refills provided  ? ?Follow up in 6 months or sooner if needed.  ?It was a pleasure seeing you again today!  ?

## 2021-07-11 ENCOUNTER — Ambulatory Visit (INDEPENDENT_AMBULATORY_CARE_PROVIDER_SITE_OTHER): Payer: No Typology Code available for payment source | Admitting: *Deleted

## 2021-07-11 DIAGNOSIS — J309 Allergic rhinitis, unspecified: Secondary | ICD-10-CM | POA: Diagnosis not present

## 2021-07-16 ENCOUNTER — Ambulatory Visit (INDEPENDENT_AMBULATORY_CARE_PROVIDER_SITE_OTHER): Payer: No Typology Code available for payment source | Admitting: *Deleted

## 2021-07-16 ENCOUNTER — Encounter: Payer: Self-pay | Admitting: Allergy & Immunology

## 2021-07-16 DIAGNOSIS — J309 Allergic rhinitis, unspecified: Secondary | ICD-10-CM

## 2021-07-25 ENCOUNTER — Telehealth: Payer: Self-pay

## 2021-07-25 ENCOUNTER — Ambulatory Visit (INDEPENDENT_AMBULATORY_CARE_PROVIDER_SITE_OTHER): Payer: No Typology Code available for payment source

## 2021-07-25 DIAGNOSIS — J309 Allergic rhinitis, unspecified: Secondary | ICD-10-CM | POA: Diagnosis not present

## 2021-07-25 NOTE — Telephone Encounter (Signed)
Error

## 2021-07-30 ENCOUNTER — Ambulatory Visit (INDEPENDENT_AMBULATORY_CARE_PROVIDER_SITE_OTHER): Payer: No Typology Code available for payment source

## 2021-07-30 ENCOUNTER — Encounter: Payer: Self-pay | Admitting: Allergy & Immunology

## 2021-07-30 DIAGNOSIS — J309 Allergic rhinitis, unspecified: Secondary | ICD-10-CM | POA: Diagnosis not present

## 2021-08-05 ENCOUNTER — Telehealth: Payer: Self-pay | Admitting: Internal Medicine

## 2021-08-05 NOTE — Telephone Encounter (Signed)
Faxed renewal of authorization request to [facility], [phone] and emailed it to [employer] ([email or web address]) ? ?

## 2021-08-06 DIAGNOSIS — J301 Allergic rhinitis due to pollen: Secondary | ICD-10-CM | POA: Diagnosis not present

## 2021-08-06 NOTE — Progress Notes (Signed)
VIALS EXP 08-07-22 ?

## 2021-08-07 DIAGNOSIS — J3089 Other allergic rhinitis: Secondary | ICD-10-CM | POA: Diagnosis not present

## 2021-08-08 ENCOUNTER — Ambulatory Visit (INDEPENDENT_AMBULATORY_CARE_PROVIDER_SITE_OTHER): Payer: No Typology Code available for payment source

## 2021-08-08 DIAGNOSIS — J309 Allergic rhinitis, unspecified: Secondary | ICD-10-CM

## 2021-08-13 ENCOUNTER — Ambulatory Visit (INDEPENDENT_AMBULATORY_CARE_PROVIDER_SITE_OTHER): Payer: No Typology Code available for payment source

## 2021-08-13 ENCOUNTER — Encounter: Payer: Self-pay | Admitting: Allergy & Immunology

## 2021-08-13 DIAGNOSIS — J309 Allergic rhinitis, unspecified: Secondary | ICD-10-CM | POA: Diagnosis not present

## 2021-08-13 NOTE — Telephone Encounter (Signed)
Received updated authorization [id number]. I have updated it in our system.  ?

## 2021-08-22 ENCOUNTER — Encounter: Payer: Self-pay | Admitting: Allergy & Immunology

## 2021-08-22 ENCOUNTER — Ambulatory Visit (INDEPENDENT_AMBULATORY_CARE_PROVIDER_SITE_OTHER): Payer: No Typology Code available for payment source

## 2021-08-22 DIAGNOSIS — J309 Allergic rhinitis, unspecified: Secondary | ICD-10-CM | POA: Diagnosis not present

## 2021-09-03 ENCOUNTER — Encounter: Payer: Self-pay | Admitting: Family Medicine

## 2021-09-03 ENCOUNTER — Ambulatory Visit (INDEPENDENT_AMBULATORY_CARE_PROVIDER_SITE_OTHER): Payer: No Typology Code available for payment source

## 2021-09-03 ENCOUNTER — Encounter: Payer: Self-pay | Admitting: Allergy & Immunology

## 2021-09-03 ENCOUNTER — Ambulatory Visit (INDEPENDENT_AMBULATORY_CARE_PROVIDER_SITE_OTHER): Payer: No Typology Code available for payment source | Admitting: Family Medicine

## 2021-09-03 VITALS — BP 130/78 | HR 73 | Ht 66.0 in | Wt 222.0 lb

## 2021-09-03 DIAGNOSIS — R519 Headache, unspecified: Secondary | ICD-10-CM

## 2021-09-03 DIAGNOSIS — G43009 Migraine without aura, not intractable, without status migrainosus: Secondary | ICD-10-CM | POA: Diagnosis not present

## 2021-09-03 DIAGNOSIS — J309 Allergic rhinitis, unspecified: Secondary | ICD-10-CM

## 2021-09-03 NOTE — Patient Instructions (Addendum)
Below is our plan: ? ?We will plan to continue propranolol 120mg  and amitriptyline 10mg  daily at bedtime assuming you are taking both of these medications. When you get home, please verify with me what medications you are taking.  ? ?Please continue close follow up with your [facility] provider for management of sleep apnea.  ? ?Please make sure you are staying well hydrated. I recommend 50-60 ounces daily. Well balanced diet and regular exercise encouraged. Consistent sleep schedule with 6-8 hours recommended.  ? ?Please continue follow up with care team as directed.  ? ?Follow up with me in 1 year if doing well.  ? ?You may receive a survey regarding today's visit. I encourage you to leave honest feed back as I do use this information to improve patient care. Thank you for seeing me today!  ? ? ?

## 2021-09-03 NOTE — Progress Notes (Signed)
? ? ?Chief Complaint  ?Patient presents with  ? Follow-up  ?  Rm 1, alone. Here for 6 month HA f/u. Pt has using a different nasal mask and has helped w his HA. HA are 3 week, severity is not as bad. Migraines are 2-3 a week.   ? ? ?HISTORY OF PRESENT ILLNESS: ? ?09/03/21 ALL:  ?Brandon Suarez returns for follow up for headaches. He was last seen 03/2021 and we advised to continue propranolol 120mg  daily and add amitriptyline 10mg  QHS. He tells me, today, that he isn't sure what he is taking. He thinks he is taking propranolol and tells me he is taking something at night but not sure if it is amitriptyline. He does feel headaches are better. He has about 12 headache days a month with about Rest helps most with migraines. He is now using [facility] for management of sleep apnea. He has not had HST on [place] but reports that his wife says he doesn't snore as much. He thinks headaches have gotten better since using this.  ? ?03/20/2021 ALL:  ?Brandon Suarez is a 31 y.o. male here today for follow up for headaches. He was started on propranolol at consult with Dr Carlus Pavlov 11/2020. Dose was increased to 120mg  daily 01/30/21. MRI/MRA normal. He is not sure headaches are any better. He has tolerated propranolol but continues to have hear daily headaches. He has 4-8 migrainous headaches a month.  ? ?He is followed by the [facility] for OSA. He is treated with a nasal device. He is not sure this has helped. He does not sleep well. He usually goes to bed at 12-1am and up at 4-5am. He has difficulty falling asleep. He takes melatonin but unsure of dose. He reports getting an rx through the . Epic noted 6mg  daily. He continues immunotherapy for allergies.  ? ?HISTORY (copied from Dr 02/01/21 previous note) ? ?HPI:  Brandon Suarez is a 31 y.o. male here as requested by , MD for daily headaches. PMHx sleep apnea, insomnia, depression, arthritis. He is not using his cpap. The headaches "come and go", whenever, he can be at work or sitting  on the couch watching TV, he can wake with them in the mornings, mouth is dry in the mornings, He is not sure if his sleep apnea is mild. He has constant throbbing, he can hear the throbbing, it is on both sides, sometimes in the temples, sometimes in the backer part of the back of the head, doesn't last an hour, maybe 20 minutes, a cool area helps, noise makes it worse, bright lights make it worse, ibuprofen may help a little. And ice pak helps. No nausea or vomiting. No vision changes. No other focal neurologic symptoms. They get worse bendingstooping and working out. Pulsatile tinnitus. Started a few months ago, no inciting events, no head trauma, no new medications.  ?  ?Reviewed notes, labs and imaging from outside physicians, which showed: ?  ?10/07/2020: ?Cbc/cmp: unremarkable  ?  ?Medications tried that can be used in migraine management include: Flexeril, ibuprofen, melatonin, meloxicam, naproxen, prednisone. ? ? ?REVIEW OF SYSTEMS: Out of a complete 14 system review of symptoms, the patient complains only of the following symptoms, headaches, insomnia and all other reviewed systems are negative. ? ? ?ALLERGIES: ?No Known Allergies ? ? ?HOME MEDICATIONS: ?Outpatient Medications Prior to Visit  ?Medication Sig Dispense Refill  ? amitriptyline (ELAVIL) 10 MG tablet Take 1 tablet (10 mg total) by mouth at bedtime. 30 tablet 5  ?  azelastine (ASTELIN) 0.1 % nasal spray Place 1-2 sprays into both nostrils 2 (two) times daily as needed (NASAL CONGESTION STUFFINESS). Use in each nostril as directed 30 mL 5  ? cetirizine (ZYRTEC) 10 MG tablet Take by mouth.    ? clotrimazole-betamethasone (LOTRISONE) cream Apply 1 application topically 2 (two) times daily. 30 g 3  ? Emollient (VANICREAM) LOTN Apply 1 application topically daily as needed. 453 mL 5  ? Emollient (VANICREAM) OINT Apply 1 application topically daily as needed. 70 g 5  ? EPINEPHrine 0.3 mg/0.3 mL IJ SOAJ injection Inject 0.3 mg into the muscle as needed. 1  each 2  ? fexofenadine (ALLEGRA ALLERGY) 180 MG tablet Take 1 tablet (180 mg total) by mouth daily. 30 tablet 5  ? fluticasone (FLONASE) 50 MCG/ACT nasal spray Place 1 spray into both nostrils 2 (two) times daily as needed (nasal congestion). 16 g 5  ? ibuprofen (ADVIL) 800 MG tablet TAKE ONE TABLET BY MOUTH TWICE A DAY AS NEEDED (TAKE WITH FOOD)    ? melatonin 3 MG TABS tablet Take 6 mg by mouth at bedtime.    ? metaxalone (SKELAXIN) 800 MG tablet metaxalone 800 mg oral tablet ?Start Date: 06/03/19 ?Status: Ordered    ? montelukast (SINGULAIR) 10 MG tablet Take 1 tablet (10 mg total) by mouth at bedtime. TAKE IN ADDITION TO ZYRTEC (CETIRIZINE) 30 tablet 5  ? naproxen (NAPROSYN) 500 MG tablet     ? olopatadine (PATANOL) 0.1 % ophthalmic solution Place 1 drop into both eyes 2 (two) times daily as needed (itchy/watery). 5 mL 5  ? pantoprazole (PROTONIX) 20 MG tablet     ? propranolol ER (INDERAL LA) 120 MG 24 hr capsule Take 1 capsule (120 mg total) by mouth at bedtime. 30 capsule 6  ? Skin Protectants, Misc. (EUCERIN) cream Apply topically as needed for dry skin. 454 g 5  ? valACYclovir (VALTREX) 1000 MG tablet 1,000 mg daily. Prn for cold sores.    ? ?No facility-administered medications prior to visit.  ? ? ? ?PAST MEDICAL HISTORY: ?Past Medical History:  ?Diagnosis Date  ? Allergy   ? Headache   ? ? ? ?PAST SURGICAL HISTORY: ?History reviewed. No pertinent surgical history. ? ? ?FAMILY HISTORY: ?Family History  ?Problem Relation Age of Onset  ? Hypertension Father   ? Migraines Neg Hx   ? ? ? ?SOCIAL HISTORY: ?Social History  ? ?Socioeconomic History  ? Marital status: Married  ?  Spouse name: Not on file  ? Number of children: Not on file  ? Years of education: Not on file  ? Highest education level: Not on file  ?Occupational History  ? Not on file  ?Tobacco Use  ? Smoking status: Never  ? Smokeless tobacco: Never  ?Vaping Use  ? Vaping Use: Never used  ?Substance and Sexual Activity  ? Alcohol use: Yes  ?   Comment: 2  ? Drug use: Never  ? Sexual activity: Yes  ?Other Topics Concern  ? Not on file  ?Social History Narrative  ? Caffeine none, soda's none.  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    ? ?Social Determinants of Health  ? ?Financial Resource Strain: Not on file  ?Food Insecurity: Not on file  ?Transportation Needs: Not on file  ?Physical Activity: Not on file  ?Stress: Not on file  ?Social Connections: Not on file  ?Intimate Partner Violence: Not on file  ? ? ? ?PHYSICAL EXAM ? ?Vitals:  ? 09/03/21 1344  ?  BP: 130/78  ?Pulse: 73  ?Weight: 222 lb (100.7 kg)  ?Height: 5\' 6"  (1.676 m)  ? ? ?Body mass index is 35.83 kg/m?. ? ?Generalized: Well developed, in no acute distress ? ?Cardiology: normal rate and rhythm, no murmur auscultated  ?Respiratory: clear to auscultation bilaterally   ? ?Neurological examination  ?Mentation: Alert oriented to time, place, history taking. Follows all commands speech and language fluent ?Cranial nerve II-XII: Pupils were equal round reactive to light. Extraocular movements were full, visual field were full on confrontational test. Facial sensation and strength were normal. Head turning and shoulder shrug  were normal and symmetric. ?Motor: The motor testing reveals 5 over 5 strength of all 4 extremities. Good symmetric motor tone is noted throughout.  ?Coordination: Cerebellar testing reveals good finger-nose-finger and heel-to-shin bilaterally.  ?Gait and station: Gait is normal.  ? ? ?DIAGNOSTIC DATA (LABS, IMAGING, TESTING) ?- I reviewed patient records, labs, notes, testing and imaging myself where available. ? ?Lab Results  ?Component Value Date  ? WBC 7.8 09/26/2015  ? HGB 15.9 09/26/2015  ? HCT 45.0 09/26/2015  ? MCV 83.4 09/26/2015  ? ?No results found for: NA, K, CL, CO2, GLUCOSE, BUN, CREATININE, CALCIUM, PROT, ALBUMIN, AST, ALT, ALKPHOS, BILITOT, GFRNONAA, GFRAA ?No results found for: CHOL, HDL, LDLCALC, LDLDIRECT, TRIG, CHOLHDL ?No results found for: HGBA1C ?No  results found for: VITAMINB12 ?Lab Results  ?Component Value Date  ? TSH 1.530 11/27/2020  ? ? ?   ? View : No data to display.  ?  ?  ?  ? ? ? ?   ? View : No data to display.  ?  ?  ?  ? ? ? ?ASSESSMENT A

## 2021-09-06 ENCOUNTER — Ambulatory Visit: Payer: Self-pay

## 2021-09-06 DIAGNOSIS — J309 Allergic rhinitis, unspecified: Secondary | ICD-10-CM

## 2021-09-07 ENCOUNTER — Encounter: Payer: Self-pay | Admitting: Family Medicine

## 2021-09-09 ENCOUNTER — Ambulatory Visit (INDEPENDENT_AMBULATORY_CARE_PROVIDER_SITE_OTHER): Payer: No Typology Code available for payment source

## 2021-09-09 ENCOUNTER — Encounter: Payer: Self-pay | Admitting: Allergy

## 2021-09-09 DIAGNOSIS — J309 Allergic rhinitis, unspecified: Secondary | ICD-10-CM | POA: Diagnosis not present

## 2021-09-16 ENCOUNTER — Ambulatory Visit (INDEPENDENT_AMBULATORY_CARE_PROVIDER_SITE_OTHER): Payer: No Typology Code available for payment source

## 2021-09-16 ENCOUNTER — Encounter: Payer: Self-pay | Admitting: Allergy

## 2021-09-16 DIAGNOSIS — J309 Allergic rhinitis, unspecified: Secondary | ICD-10-CM

## 2021-09-19 ENCOUNTER — Ambulatory Visit: Payer: No Typology Code available for payment source | Admitting: Family Medicine

## 2021-09-27 ENCOUNTER — Encounter: Payer: Self-pay | Admitting: Allergy

## 2021-09-27 ENCOUNTER — Ambulatory Visit (INDEPENDENT_AMBULATORY_CARE_PROVIDER_SITE_OTHER): Payer: No Typology Code available for payment source

## 2021-09-27 DIAGNOSIS — J309 Allergic rhinitis, unspecified: Secondary | ICD-10-CM | POA: Diagnosis not present

## 2021-10-07 ENCOUNTER — Encounter: Payer: Self-pay | Admitting: Family

## 2021-10-07 ENCOUNTER — Ambulatory Visit (INDEPENDENT_AMBULATORY_CARE_PROVIDER_SITE_OTHER): Payer: No Typology Code available for payment source

## 2021-10-07 DIAGNOSIS — J309 Allergic rhinitis, unspecified: Secondary | ICD-10-CM | POA: Diagnosis not present

## 2021-10-14 ENCOUNTER — Encounter: Payer: Self-pay | Admitting: Internal Medicine

## 2021-10-14 ENCOUNTER — Ambulatory Visit (INDEPENDENT_AMBULATORY_CARE_PROVIDER_SITE_OTHER): Payer: No Typology Code available for payment source

## 2021-10-14 DIAGNOSIS — J309 Allergic rhinitis, unspecified: Secondary | ICD-10-CM | POA: Diagnosis not present

## 2021-10-21 ENCOUNTER — Ambulatory Visit (INDEPENDENT_AMBULATORY_CARE_PROVIDER_SITE_OTHER): Payer: No Typology Code available for payment source

## 2021-10-21 ENCOUNTER — Encounter: Payer: Self-pay | Admitting: Internal Medicine

## 2021-10-21 DIAGNOSIS — J309 Allergic rhinitis, unspecified: Secondary | ICD-10-CM | POA: Diagnosis not present

## 2021-10-28 ENCOUNTER — Encounter: Payer: Self-pay | Admitting: Allergy

## 2021-10-28 ENCOUNTER — Ambulatory Visit (INDEPENDENT_AMBULATORY_CARE_PROVIDER_SITE_OTHER): Payer: No Typology Code available for payment source

## 2021-10-28 DIAGNOSIS — J309 Allergic rhinitis, unspecified: Secondary | ICD-10-CM | POA: Diagnosis not present

## 2021-11-06 ENCOUNTER — Ambulatory Visit (INDEPENDENT_AMBULATORY_CARE_PROVIDER_SITE_OTHER): Payer: No Typology Code available for payment source

## 2021-11-06 ENCOUNTER — Encounter: Payer: Self-pay | Admitting: Internal Medicine

## 2021-11-06 DIAGNOSIS — J309 Allergic rhinitis, unspecified: Secondary | ICD-10-CM

## 2021-11-07 DIAGNOSIS — J301 Allergic rhinitis due to pollen: Secondary | ICD-10-CM

## 2021-11-07 NOTE — Progress Notes (Signed)
VIALS EXP 11-08-22 

## 2021-11-08 DIAGNOSIS — J3089 Other allergic rhinitis: Secondary | ICD-10-CM

## 2021-11-12 ENCOUNTER — Encounter: Payer: Self-pay | Admitting: Allergy and Immunology

## 2021-11-12 ENCOUNTER — Ambulatory Visit (INDEPENDENT_AMBULATORY_CARE_PROVIDER_SITE_OTHER): Payer: No Typology Code available for payment source

## 2021-11-12 DIAGNOSIS — J309 Allergic rhinitis, unspecified: Secondary | ICD-10-CM

## 2021-11-18 ENCOUNTER — Ambulatory Visit (INDEPENDENT_AMBULATORY_CARE_PROVIDER_SITE_OTHER): Payer: No Typology Code available for payment source

## 2021-11-18 ENCOUNTER — Encounter: Payer: Self-pay | Admitting: Internal Medicine

## 2021-11-18 DIAGNOSIS — J309 Allergic rhinitis, unspecified: Secondary | ICD-10-CM

## 2021-11-22 ENCOUNTER — Telehealth: Payer: Self-pay

## 2021-11-22 NOTE — Telephone Encounter (Signed)
Patient called and states a while back he was given a sample of the hydrocortisone cream to help with the "bumps" and the itching he was having. Patient would like a prescription sent in for hydrocortisone. Please advice  [facility] [place]

## 2021-11-22 NOTE — Telephone Encounter (Signed)
Hi Brandon Suarez-we did send in eucerin to the [place] and they accepted it.  I think once Brandon Suarez advised he buy OTC hydrocoritsone for itching.  We didn't give samples of either.  We did give samples of vanicream but that wasn't covered by [facility].  Can you ask which he is looking for and send in please? Thanks.

## 2021-11-25 ENCOUNTER — Encounter: Payer: Self-pay | Admitting: Family Medicine

## 2021-11-25 MED ORDER — HYDROCORTISONE 2.5 % EX CREA
TOPICAL_CREAM | CUTANEOUS | 5 refills | Status: AC | PRN
Start: 2021-11-25 — End: ?

## 2021-11-25 NOTE — Addendum Note (Signed)
Addended by: Deborra Medina on: 11/25/2021 04:56 PM   Modules accepted: Orders

## 2021-11-25 NOTE — Telephone Encounter (Signed)
Sure-we can send in hydrocortisone 2.5% to [facility]. Thanks.

## 2021-11-25 NOTE — Telephone Encounter (Signed)
Patient states it is the hydrocortisone. It is not documented that samples were provided, I did inform him that he can get it OTC unless you would like for [place] to send a prescription.

## 2021-11-25 NOTE — Telephone Encounter (Signed)
Prescription sent in  

## 2021-11-26 ENCOUNTER — Encounter: Payer: Self-pay | Admitting: Allergy & Immunology

## 2021-11-26 ENCOUNTER — Ambulatory Visit (INDEPENDENT_AMBULATORY_CARE_PROVIDER_SITE_OTHER): Payer: No Typology Code available for payment source

## 2021-11-26 DIAGNOSIS — J309 Allergic rhinitis, unspecified: Secondary | ICD-10-CM

## 2021-12-02 ENCOUNTER — Ambulatory Visit (INDEPENDENT_AMBULATORY_CARE_PROVIDER_SITE_OTHER): Payer: No Typology Code available for payment source

## 2021-12-02 ENCOUNTER — Encounter: Payer: Self-pay | Admitting: Allergy

## 2021-12-02 DIAGNOSIS — J309 Allergic rhinitis, unspecified: Secondary | ICD-10-CM

## 2021-12-09 ENCOUNTER — Encounter: Payer: Self-pay | Admitting: Allergy

## 2021-12-09 ENCOUNTER — Ambulatory Visit (INDEPENDENT_AMBULATORY_CARE_PROVIDER_SITE_OTHER): Payer: No Typology Code available for payment source

## 2021-12-09 DIAGNOSIS — J309 Allergic rhinitis, unspecified: Secondary | ICD-10-CM

## 2021-12-18 ENCOUNTER — Encounter: Payer: Self-pay | Admitting: Allergy

## 2021-12-18 ENCOUNTER — Ambulatory Visit (INDEPENDENT_AMBULATORY_CARE_PROVIDER_SITE_OTHER): Payer: No Typology Code available for payment source

## 2021-12-18 DIAGNOSIS — J309 Allergic rhinitis, unspecified: Secondary | ICD-10-CM | POA: Diagnosis not present

## 2021-12-23 ENCOUNTER — Encounter: Payer: Self-pay | Admitting: Allergy

## 2021-12-23 ENCOUNTER — Ambulatory Visit (INDEPENDENT_AMBULATORY_CARE_PROVIDER_SITE_OTHER): Payer: No Typology Code available for payment source | Admitting: *Deleted

## 2021-12-23 DIAGNOSIS — J309 Allergic rhinitis, unspecified: Secondary | ICD-10-CM

## 2022-01-09 ENCOUNTER — Encounter: Payer: Self-pay | Admitting: Allergy

## 2022-01-09 ENCOUNTER — Ambulatory Visit: Payer: Self-pay | Admitting: *Deleted

## 2022-01-09 DIAGNOSIS — J309 Allergic rhinitis, unspecified: Secondary | ICD-10-CM | POA: Diagnosis not present

## 2022-01-13 ENCOUNTER — Ambulatory Visit (INDEPENDENT_AMBULATORY_CARE_PROVIDER_SITE_OTHER): Payer: No Typology Code available for payment source

## 2022-01-13 ENCOUNTER — Encounter: Payer: Self-pay | Admitting: Allergy

## 2022-01-13 DIAGNOSIS — J309 Allergic rhinitis, unspecified: Secondary | ICD-10-CM | POA: Diagnosis not present

## 2022-01-20 ENCOUNTER — Encounter: Payer: Self-pay | Admitting: Allergy

## 2022-01-20 ENCOUNTER — Encounter: Payer: Self-pay | Admitting: Family

## 2022-01-20 ENCOUNTER — Ambulatory Visit (INDEPENDENT_AMBULATORY_CARE_PROVIDER_SITE_OTHER): Payer: No Typology Code available for payment source | Admitting: *Deleted

## 2022-01-20 DIAGNOSIS — J309 Allergic rhinitis, unspecified: Secondary | ICD-10-CM | POA: Diagnosis not present

## 2022-01-29 ENCOUNTER — Ambulatory Visit: Payer: No Typology Code available for payment source | Admitting: Internal Medicine

## 2022-02-03 ENCOUNTER — Ambulatory Visit (INDEPENDENT_AMBULATORY_CARE_PROVIDER_SITE_OTHER): Payer: No Typology Code available for payment source

## 2022-02-03 ENCOUNTER — Encounter: Payer: Self-pay | Admitting: Allergy

## 2022-02-03 DIAGNOSIS — J309 Allergic rhinitis, unspecified: Secondary | ICD-10-CM | POA: Diagnosis not present

## 2022-02-17 ENCOUNTER — Ambulatory Visit (INDEPENDENT_AMBULATORY_CARE_PROVIDER_SITE_OTHER): Payer: No Typology Code available for payment source | Admitting: *Deleted

## 2022-02-17 ENCOUNTER — Encounter: Payer: Self-pay | Admitting: Family

## 2022-02-17 DIAGNOSIS — J309 Allergic rhinitis, unspecified: Secondary | ICD-10-CM | POA: Diagnosis not present

## 2022-02-19 DIAGNOSIS — J301 Allergic rhinitis due to pollen: Secondary | ICD-10-CM | POA: Diagnosis not present

## 2022-02-19 NOTE — Progress Notes (Signed)
VIALS EXP 02-20-23 

## 2022-02-20 DIAGNOSIS — J3089 Other allergic rhinitis: Secondary | ICD-10-CM | POA: Diagnosis not present

## 2022-02-21 ENCOUNTER — Ambulatory Visit: Payer: No Typology Code available for payment source | Admitting: Internal Medicine

## 2022-03-04 ENCOUNTER — Encounter: Payer: Self-pay | Admitting: Internal Medicine

## 2022-03-04 ENCOUNTER — Ambulatory Visit (INDEPENDENT_AMBULATORY_CARE_PROVIDER_SITE_OTHER): Payer: No Typology Code available for payment source | Admitting: Internal Medicine

## 2022-03-04 VITALS — BP 118/70 | HR 71 | Temp 97.9°F | Resp 20 | Wt 215.1 lb

## 2022-03-04 DIAGNOSIS — L219 Seborrheic dermatitis, unspecified: Secondary | ICD-10-CM | POA: Diagnosis not present

## 2022-03-04 DIAGNOSIS — J302 Other seasonal allergic rhinitis: Secondary | ICD-10-CM

## 2022-03-04 DIAGNOSIS — J3089 Other allergic rhinitis: Secondary | ICD-10-CM

## 2022-03-04 MED ORDER — EPINEPHRINE 0.3 MG/0.3ML IJ SOAJ: 0.3000 mg | INTRAMUSCULAR | 2 refills | Status: DC | PRN

## 2022-03-04 MED ORDER — KETOCONAZOLE 2 % EX CREA: 1.0000 | TOPICAL_CREAM | Freq: Every day | CUTANEOUS | 0 refills | Status: DC

## 2022-03-04 MED ORDER — FEXOFENADINE HCL 180 MG PO TABS
180.0000 mg | ORAL_TABLET | Freq: Every day | ORAL | 5 refills | Status: DC
Start: 2022-03-04 — End: 2023-01-13

## 2022-03-04 MED ORDER — MONTELUKAST SODIUM 10 MG PO TABS: 10.0000 mg | ORAL_TABLET | Freq: Every day | ORAL | 5 refills | Status: AC

## 2022-03-04 MED ORDER — KETOCONAZOLE 2 % EX SHAM: 1.0000 | MEDICATED_SHAMPOO | CUTANEOUS | 0 refills | Status: DC

## 2022-03-04 NOTE — Patient Instructions (Addendum)
Environmental allergies- Continue environmental control measures directed at pollens, molds, cockroaches, dust mites May use over the counter antihistamines such as Zyrtec (cetirizine), Claritin (loratadine), Allegra (fexofenadine), or Xyzal (levocetirizine) daily as needed. May take twice a day if needed.  Restart  Singulair (montelukast) 10mg  daily at night.   Failed flonase, atrovent nasal sprays.   Nasal saline spray (i.e., Simply Saline) or nasal saline lavage (i.e., NeilMed) is recommended as needed  May use olopatadine eye drops 0.1% twice a day as needed for itchy/watery eyes. Continue allergy shots as scheduled.  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  Don't hesitate to reach out if you have any additional concerns!  Roney Marion, MD  [facility] [place], [place]

## 2022-03-04 NOTE — Progress Notes (Signed)
Follow Up Note  RE: Brandon Suarez MRN: [id number] DOB: [birth year] Date of Office Visit: 03/04/2022  Referring provider: Bea Laura, MD Primary care provider: Tana Coast, MD  Chief Complaint: Follow-up and Medication Refill  History of Present Illness: I had the pleasure of seeing Brandon Suarez for a follow up visit at the [facility] on 03/04/2022. He is a 31 y.o. male, who is being followed for allergic rhinitis on AIT, conjunctivitis, dry skin. His previous allergy office visit was on 07/03/2021 with Dr. Simona Huh. Today is a regular follow up visit.  History obtained from patient, chart review.  Allergic Rhinitis - Medical therapy: He takes Zyrtec 10 mg daily, ran out of his Singulair and requests refill.  Does not tolerate nasal sprays.  Does feel like allergy injections are helping. - Symptoms: Sneezing, nasal congestion - Adverse effects of medications: No side effects but feels like Flonase and Atrovent did not help him - Allergy testing history: 2021 skin testing positive to grass, ragweed, weeds, trees, mold, cat, cockroach and dust mite. -SCIT started on 09/21/2020 - Immunotherapy: Last injection 02/17/2022 of 0.5 of the red vial at every 2 week interval, vial 1 (G-RW-[id number]-T) vial 2 (M-C-CR-DM), he receives his allergy injections at the [facility] office but is interested in transferring care to [facility] as he recently moved here - Large Local Reactions: Denies - Systemic Reactions: Denies - Beta Blockers: Denies - History of Reflux denies - History of Sinus Surgery denies   Dry skin:  -Feels like dry skin is worse despite use of Eucerin.  We will get flaking along his eyebrows, beard and significant dandruff on scalp.  He has not been treated for seborrheic dermatitis in the past.   Assessment and Plan: Brandon Suarez is a 31 y.o. male with: Seasonal and perennial allergic rhinitis  Seborrheic dermatitis Plan: Patient Instructions  Environmental  allergies- Continue environmental control measures directed at pollens, molds, cockroaches, dust mites May use over the counter antihistamines such as Zyrtec (cetirizine), Claritin (loratadine), Allegra (fexofenadine), or Xyzal (levocetirizine) daily as needed. May take twice a day if needed.  Restart  Singulair (montelukast) 10mg  daily at night.   Failed flonase, atrovent nasal sprays.   Nasal saline spray (i.e., Simply Saline) or nasal saline lavage (i.e., NeilMed) is recommended as needed  May use olopatadine eye drops 0.1% twice a day as needed for itchy/watery eyes. Continue allergy shots as scheduled.  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  Don't hesitate to reach out if you have any additional concerns!  Brandon Marion, MD  [facility] [place] [place]  No follow-ups on file.  Meds ordered this encounter  Medications   EPINEPHrine 0.3 mg/0.3 mL IJ SOAJ injection    Sig: Inject 0.3 mg into the muscle as needed.    Dispense:  1 each    Refill:  2    May dispense generic/Mylan/Teva brand.   fexofenadine (ALLEGRA ALLERGY) 180 MG tablet    Sig: Take 1 tablet (180 mg total) by mouth daily.    Dispense:  30 tablet    Refill:  5   montelukast (SINGULAIR) 10 MG tablet    Sig: Take 1 tablet (  10 mg total) by mouth at bedtime. TAKE IN ADDITION TO ZYRTEC (CETIRIZINE)    Dispense:  30 tablet    Refill:  5   ketoconazole (NIZORAL) 2 % shampoo    Sig: Apply 1 Application topically 2 (two) times a week.    Dispense:  120 mL    Refill:  0   ketoconazole (NIZORAL) 2 % cream    Sig: Apply 1 Application topically daily.    Dispense:  15 g    Refill:  0    Lab Orders  No laboratory test(s) ordered today   Diagnostics: None  done    Medication List:  Current Outpatient Medications  Medication Sig Dispense Refill   amitriptyline (ELAVIL) 10 MG tablet Take 1 tablet (10 mg total) by mouth at bedtime. 30 tablet 5   azelastine (ASTELIN) 0.1 % nasal spray Place 1-2 sprays into both nostrils 2 (two) times daily as needed (NASAL CONGESTION STUFFINESS). Use in each nostril as directed 30 mL 5   cetirizine (ZYRTEC) 10 MG tablet Take by mouth.     clotrimazole-betamethasone (LOTRISONE) cream Apply 1 application topically 2 (two) times daily. 30 g 3   Emollient (VANICREAM) LOTN Apply 1 application topically daily as needed. 453 mL 5   Emollient (VANICREAM) OINT Apply 1 application topically daily as needed. 70 g 5   fluticasone (FLONASE) 50 MCG/ACT nasal spray Place 1 spray into both nostrils 2 (two) times daily as needed (nasal congestion). 16 g 5   hydrocortisone 2.5 % cream Apply topically as needed. 28 g 5   ibuprofen (ADVIL) 800 MG tablet TAKE ONE TABLET BY MOUTH TWICE A DAY AS NEEDED (TAKE WITH FOOD)     ketoconazole (NIZORAL) 2 % cream Apply 1 Application topically daily. 15 g 0   [START ON 03/06/2022] ketoconazole (NIZORAL) 2 % shampoo Apply 1 Application topically 2 (two) times a week. 120 mL 0   melatonin 3 MG TABS tablet Take 6 mg by mouth at bedtime.     metaxalone (SKELAXIN) 800 MG tablet metaxalone 800 mg oral tablet Start Date: 06/03/19 Status: Ordered     montelukast (SINGULAIR) 10 MG tablet Take 1 tablet (10 mg total) by mouth at bedtime. TAKE IN ADDITION TO ZYRTEC (CETIRIZINE) 30 tablet 5   naproxen (NAPROSYN) 500 MG tablet      olopatadine (PATANOL) 0.1 % ophthalmic solution Place 1 drop into both eyes 2 (two) times daily as needed (itchy/watery). 5 mL 5   pantoprazole (PROTONIX) 20 MG tablet      propranolol ER (INDERAL LA) 120 MG 24 hr capsule Take 1 capsule (120 mg total) by mouth at bedtime. 30 capsule 6   Skin Protectants, Misc. (EUCERIN) cream Apply topically as needed for dry skin. 454 g 5    valACYclovir (VALTREX) 1000 MG tablet 1,000 mg daily. Prn for cold sores.     EPINEPHrine 0.3 mg/0.3 mL IJ SOAJ injection Inject 0.3 mg into the muscle as needed. 1 each 2   fexofenadine (ALLEGRA ALLERGY) 180 MG tablet Take 1 tablet (180 mg total) by mouth daily. 30 tablet 5   No current facility-administered medications for this visit.   Allergies: No Known Allergies I reviewed his past medical history, social history, family history, and environmental history and no significant changes have been reported from his previous visit.  ROS: All others negative except as noted per HPI.   Objective: BP 118/70 (BP Location: Right Arm, Patient Position: Sitting, Cuff Size: Normal)   Pulse 71   Temp  97.9 F (36.6 C) (Temporal)   Resp 20   Wt 215 lb 1.6 oz (97.6 kg)   SpO2 97%   BMI 34.72 kg/m  Body mass index is 34.72 kg/m. General Appearance:  Alert, cooperative, no distress, appears stated age  Head:  Normocephalic, without obvious abnormality, atraumatic  Eyes:  Conjunctiva clear, EOM's intact  Nose: Nares normal,   Throat: Lips, tongue normal; teeth and gums normal,   Neck: Supple, symmetrical  Lungs:   , Respirations unlabored, no coughing  Heart:  regular rate and rhythm and no murmur, Appears well perfused  Extremities: No edema  Skin: Skin color, texture, turgor normal, no rashes or lesions on visualized portions of skin "mild dandruff noticed in scalp, mild flaking along beard  Neurologic: No gross deficits   Previous notes and tests were reviewed. The plan was reviewed with the patient/family, and all questions/concerned were addressed.  It was my pleasure to see Brandon Suarez today and participate in his care. Please feel free to contact me with any questions or concerns.  Sincerely,  Ferol Luz, MD  Allergy & Immunology  [facility] Office: [phone]

## 2022-03-05 ENCOUNTER — Encounter: Payer: Self-pay | Admitting: Allergy

## 2022-03-05 ENCOUNTER — Ambulatory Visit (INDEPENDENT_AMBULATORY_CARE_PROVIDER_SITE_OTHER): Payer: No Typology Code available for payment source

## 2022-03-05 ENCOUNTER — Encounter: Payer: Self-pay | Admitting: Internal Medicine

## 2022-03-05 DIAGNOSIS — J309 Allergic rhinitis, unspecified: Secondary | ICD-10-CM | POA: Diagnosis not present

## 2022-03-17 ENCOUNTER — Encounter: Payer: Self-pay | Admitting: Family

## 2022-03-17 ENCOUNTER — Ambulatory Visit (INDEPENDENT_AMBULATORY_CARE_PROVIDER_SITE_OTHER): Payer: No Typology Code available for payment source

## 2022-03-17 DIAGNOSIS — J309 Allergic rhinitis, unspecified: Secondary | ICD-10-CM | POA: Diagnosis not present

## 2022-04-03 ENCOUNTER — Encounter: Payer: Self-pay | Admitting: Allergy

## 2022-04-03 ENCOUNTER — Ambulatory Visit (INDEPENDENT_AMBULATORY_CARE_PROVIDER_SITE_OTHER): Payer: No Typology Code available for payment source

## 2022-04-03 DIAGNOSIS — J309 Allergic rhinitis, unspecified: Secondary | ICD-10-CM

## 2022-04-15 ENCOUNTER — Ambulatory Visit (INDEPENDENT_AMBULATORY_CARE_PROVIDER_SITE_OTHER): Payer: No Typology Code available for payment source

## 2022-04-15 DIAGNOSIS — J309 Allergic rhinitis, unspecified: Secondary | ICD-10-CM

## 2022-04-23 ENCOUNTER — Ambulatory Visit (INDEPENDENT_AMBULATORY_CARE_PROVIDER_SITE_OTHER): Payer: No Typology Code available for payment source

## 2022-04-23 ENCOUNTER — Encounter: Payer: Self-pay | Admitting: Family Medicine

## 2022-04-23 DIAGNOSIS — J309 Allergic rhinitis, unspecified: Secondary | ICD-10-CM

## 2022-04-29 ENCOUNTER — Telehealth (INDEPENDENT_AMBULATORY_CARE_PROVIDER_SITE_OTHER): Payer: No Typology Code available for payment source | Admitting: Neurology

## 2022-04-29 ENCOUNTER — Telehealth: Payer: Self-pay | Admitting: Neurology

## 2022-04-29 ENCOUNTER — Encounter: Payer: Self-pay | Admitting: Neurology

## 2022-04-29 DIAGNOSIS — R519 Headache, unspecified: Secondary | ICD-10-CM

## 2022-04-29 DIAGNOSIS — G43711 Chronic migraine without aura, intractable, with status migrainosus: Secondary | ICD-10-CM | POA: Diagnosis not present

## 2022-04-29 MED ORDER — TOPIRAMATE 100 MG PO TABS: ORAL_TABLET | ORAL | 4 refills | Status: DC

## 2022-04-29 NOTE — Telephone Encounter (Signed)
F/u with Amy is scheduled for 09/04/22 at 2:00 pm.

## 2022-04-29 NOTE — Progress Notes (Signed)
Cc: daily migraine/headache  Virtual Visit via Video Note  I connected with Brandon Suarez on 04/29/22 at  2:00 PM EST by a video enabled telemedicine application and verified that I am speaking with the correct person using two identifiers.  Location: Patient: home Provider: office   I discussed the limitations of evaluation and management by telemedicine and the availability of in person appointments. The patient expressed understanding and agreed to proceed.   Follow Up Instructions:    I discussed the assessment and treatment plan with the patient. The patient was provided an opportunity to ask questions and all were answered. The patient agreed with the plan and demonstrated an understanding of the instructions.   The patient was advised to call back or seek an in-person evaluation if the symptoms worsen or if the condition fails to improve as anticipated.  I provided 30 minutes of non-face-to-face time during this encounter.   Brandon Fret, MD   HISTORY OF PRESENT ILLNESS:  04/29/2022: Still having daily headache/migraines on the propranol and on the amitriptyline(side effects making him tired).His blood pressure is "almost perfect". He is very tired. Will change amitriptyline to Topiramate. If that doesn't work then we can try some of the new medications such as Ajovy or emgality or go straight to Botox for migraines. Normal mri brain and mra head 12/2020. He has depression and is on an anti-depressant but doesn't know what. Discussed options at length.    Medications tried that can be used in migraine management include: Flexeril, ibuprofen, melatonin, meloxicam, naproxen,  Prednisone, amitriptyline, propranol, topiramate, trazodone  Patient complains of symptoms per HPI as well as the following symptoms: daily headaches . Pertinent negatives and positives per HPI. All others negative   09/03/2021 ALL:  Brandon Suarez returns for follow up for headaches. He was last seen  03/2021 and we advised to continue propranolol 120mg  daily and add amitriptyline 10mg  QHS. He tells me, today, that he isn't sure what he is taking. He thinks he is taking propranolol and tells me he is taking something at night but not sure if it is amitriptyline. He does feel headaches are better. He has about 12 headache days a month with about Rest helps most with migraines. He is now using [facility] for management of sleep apnea. He has not had HST on [place] but reports that his wife says he doesn't snore as much. He thinks headaches have gotten better since using this.   03/20/2021 ALL:  Brandon Suarez is a 31 y.o. male here today for follow up for headaches. He was started on propranolol at consult with Dr Brandon Suarez 11/2020. Dose was increased to 120mg  daily 01/30/21. MRI/MRA normal. He is not sure headaches are any better. He has tolerated propranolol but continues to have hear daily headaches. He has 4-8 migrainous headaches a month.   He is followed by the [facility] for OSA. He is treated with a nasal device. He is not sure this has helped. He does not sleep well. He usually goes to bed at 12-1am and up at 4-5am. He has difficulty falling asleep. He takes melatonin but unsure of dose. He reports getting an rx through the . Epic noted 6mg  daily. He continues immunotherapy for allergies.   HISTORY (copied from Dr 02/01/21 previous note)  HPI:  Brandon Suarez is a 31 y.o. male here as requested by , MD for daily headaches. PMHx sleep apnea, insomnia, depression, arthritis. He is not using his cpap. The headaches "come and  go", whenever, he can be at work or sitting on the couch watching TV, he can wake with them in the mornings, mouth is dry in the mornings, He is not sure if his sleep apnea is mild. He has constant throbbing, he can hear the throbbing, it is on both sides, sometimes in the temples, sometimes in the backer part of the back of the head, doesn't last an hour, maybe 20 minutes, a cool  area helps, noise makes it worse, bright lights make it worse, ibuprofen may help a little. And ice pak helps. No nausea or vomiting. No vision changes. No other focal neurologic symptoms. They get worse bendingstooping and working out. Pulsatile tinnitus. Started a few months ago, no inciting events, no head trauma, no new medications.    Reviewed notes, labs and imaging from outside physicians, which showed:   10/07/2020: Cbc/cmp: unremarkable       REVIEW OF SYSTEMS: Out of a complete 14 system review of symptoms, the patient complains only of the following symptoms, headaches, insomnia and all other reviewed systems are negative.   ALLERGIES: No Known Allergies   HOME MEDICATIONS: Outpatient Medications Prior to Visit  Medication Sig Dispense Refill   azelastine (ASTELIN) 0.1 % nasal spray Place 1-2 sprays into both nostrils 2 (two) times daily as needed (NASAL CONGESTION STUFFINESS). Use in each nostril as directed 30 mL 5   cetirizine (ZYRTEC) 10 MG tablet Take by mouth.     clotrimazole-betamethasone (LOTRISONE) cream Apply 1 application topically 2 (two) times daily. 30 g 3   Emollient (VANICREAM) LOTN Apply 1 application topically daily as needed. 453 mL 5   Emollient (VANICREAM) OINT Apply 1 application topically daily as needed. 70 g 5   EPINEPHrine 0.3 mg/0.3 mL IJ SOAJ injection Inject 0.3 mg into the muscle as needed. 1 each 2   fexofenadine (ALLEGRA ALLERGY) 180 MG tablet Take 1 tablet (180 mg total) by mouth daily. 30 tablet 5   fluticasone (FLONASE) 50 MCG/ACT nasal spray Place 1 spray into both nostrils 2 (two) times daily as needed (nasal congestion). 16 g 5   hydrocortisone 2.5 % cream Apply topically as needed. 28 g 5   ibuprofen (ADVIL) 800 MG tablet TAKE ONE TABLET BY MOUTH TWICE A DAY AS NEEDED (TAKE WITH FOOD)     ketoconazole (NIZORAL) 2 % cream Apply 1 Application topically daily. 15 g 0   ketoconazole (NIZORAL) 2 % shampoo Apply 1 Application topically 2 (two)  times a week. 120 mL 0   melatonin 3 MG TABS tablet Take 6 mg by mouth at bedtime.     metaxalone (SKELAXIN) 800 MG tablet metaxalone 800 mg oral tablet Start Date: 06/03/19 Status: Ordered     montelukast (SINGULAIR) 10 MG tablet Take 1 tablet (10 mg total) by mouth at bedtime. TAKE IN ADDITION TO ZYRTEC (CETIRIZINE) 30 tablet 5   naproxen (NAPROSYN) 500 MG tablet      olopatadine (PATANOL) 0.1 % ophthalmic solution Place 1 drop into both eyes 2 (two) times daily as needed (itchy/watery). 5 mL 5   pantoprazole (PROTONIX) 20 MG tablet      propranolol ER (INDERAL LA) 120 MG 24 hr capsule Take 1 capsule (120 mg total) by mouth at bedtime. 30 capsule 6   Skin Protectants, Misc. (EUCERIN) cream Apply topically as needed for dry skin. 454 g 5   valACYclovir (VALTREX) 1000 MG tablet 1,000 mg daily. Prn for cold sores.     amitriptyline (ELAVIL) 10 MG tablet Take  1 tablet (10 mg total) by mouth at bedtime. 30 tablet 5   No facility-administered medications prior to visit.     PAST MEDICAL HISTORY: Past Medical History:  Diagnosis Date   Allergy    Headache      PAST SURGICAL HISTORY: No past surgical history on file.   FAMILY HISTORY: Family History  Problem Relation Age of Onset   Hypertension Father    Migraines Neg Hx      SOCIAL HISTORY: Social History   Socioeconomic History   Marital status: Married    Spouse name: Not on file   Number of children: Not on file   Years of education: Not on file   Highest education level: Not on file  Occupational History   Not on file  Tobacco Use   Smoking status: Never   Smokeless tobacco: Never  Vaping Use   Vaping Use: Never used  Substance and Sexual Activity   Alcohol use: Yes    Comment: 2   Drug use: Never   Sexual activity: Yes  Other Topics Concern   Not on file  Social History Narrative   Caffeine none, soda's none.  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     Social Determinants of Health   Financial  Resource Strain: Not on file  Food Insecurity: Not on file  Transportation Needs: Not on file  Physical Activity: Not on file  Stress: Not on file  Social Connections: Not on file  Intimate Partner Violence: Not on file     PHYSICAL EXAM  There were no vitals filed for this visit.   There is no height or weight on file to calculate BMI.   Physical exam: Exam: Gen: NAD, conversant      CV:  Denies palpitations or chest pain or SOB. VS: Breathing at a normal rate. Weight appears obese. Not febrile. Eyes: Conjunctivae clear without exudates or hemorrhage  Neuro: Detailed Neurologic Exam  Speech:    Speech is normal; fluent and spontaneous with normal comprehension.  Cognition:    The patient is oriented to person, place, and time;     recent and remote memory intact;     language fluent;     normal attention, concentration,     fund of knowledge Cranial Nerves:    The pupils are equal, round, and reactive to light. Cannot perform fundoscopic exam. Visual fields are full to finger confrontation. Extraocular movements are intact.  The face is symmetric with normal sensation. The palate elevates in the midline. Hearing intact. Voice is normal. Shoulder shrug is normal. The tongue has normal motion without fasciculations.   Coordination:    Normal finger to nose  Gait:    Normal native gait  Motor Observation:   no involuntary movements noted. Tone:    Appears normal  Posture:    Posture is normal. normal erect    Strength:    Strength is anti-gravity and symmetric in the upper and lower limbs.           DIAGNOSTIC DATA (LABS, IMAGING, TESTING) - I reviewed patient records, labs, notes, testing and imaging myself where available.  Lab Results  Component Value Date   WBC 7.8 09/26/2015   HGB 15.9 09/26/2015   HCT 45.0 09/26/2015   MCV 83.4 09/26/2015   No results found for: "NA", "K", "CL", "CO2", "GLUCOSE", "BUN", "CREATININE", "CALCIUM", "PROT",  "ALBUMIN", "AST", "ALT", "ALKPHOS", "BILITOT", "GFRNONAA", "GFRAA" No results found for: "CHOL", "HDL", "LDLCALC", "LDLDIRECT", "TRIG", "CHOLHDL" No  results found for: "HGBA1C" No results found for: "VITAMINB12" Lab Results  Component Value Date   TSH 1.530 11/27/2020        No data to display               No data to display           ASSESSMENT AND PLAN  31 y.o. year old male  has a past medical history of Allergy and Headache. here with    Chronic migraine without aura, with intractable migraine, so stated, with status migrainosus  Chronic daily headache  Still having daily headache on the propranol and on the amitriptyline(side effects making him tired).His blood pressure is "almost perfect". He is very tired. Will change amitriptyline to Topiramate. If that doesn't work then we can try some of the new medications such as Ajovy or emgality or go straight to Botox for migraines. Normal mri brain and mra head 12/2020. He has depression and is on an anti-depressant but doesn't know what - trazodone. Suspect untreated sleep apnea a big contributor.   Medications tried that can be used in migraine management include: Flexeril, ibuprofen, melatonin, meloxicam, naproxen,  Prednisone, amitriptyline, propranol, topiramate, trazodone  Healthy lifestyle habits advised. I have [facility] of OSA on [facility] device. He will return to see me in 1 year if doing well, sooner if needed.    Meds ordered this encounter  Medications   DISCONTD: topiramate (TOPAMAX) 100 MG tablet    Sig: Start with 1/2(50mg ) pill at bedtime and in 2 weeks take a whole pill at bedtime every night(100mg )    Dispense:  90 tablet    Refill:  4   topiramate (TOPAMAX) 100 MG tablet    Sig: Start with 1/2(50mg ) pill at bedtime and in 2 weeks take a whole pill at bedtime every night(100mg ). Discontinue Amitriptyline please.    Dispense:  90 tablet     Refill:  4    Discontinue Amitriptyline please.       Meds ordered this encounter  Medications   DISCONTD: topiramate (TOPAMAX) 100 MG tablet    Sig: Start with 1/2(50mg ) pill at bedtime and in 2 weeks take a whole pill at bedtime every night(100mg )    Dispense:  90 tablet    Refill:  4   topiramate (TOPAMAX) 100 MG tablet    Sig: Start with 1/2(50mg ) pill at bedtime and in 2 weeks take a whole pill at bedtime every night(100mg ). Discontinue Amitriptyline please.    Dispense:  90 tablet    Refill:  4    Discontinue Amitriptyline please.       Shawnie Dapper, MSN, FNP-C 04/29/2022, 7:59 PM  [facility] [address], [address] [place], [place] [address] [phone]

## 2022-04-29 NOTE — Telephone Encounter (Signed)
Schedule him with Amy in 3-6 months follow up for migraines

## 2022-05-01 ENCOUNTER — Encounter: Payer: Self-pay | Admitting: Allergy & Immunology

## 2022-05-01 ENCOUNTER — Ambulatory Visit (INDEPENDENT_AMBULATORY_CARE_PROVIDER_SITE_OTHER): Payer: No Typology Code available for payment source

## 2022-05-01 DIAGNOSIS — J309 Allergic rhinitis, unspecified: Secondary | ICD-10-CM

## 2022-05-08 ENCOUNTER — Encounter: Payer: Self-pay | Admitting: Allergy & Immunology

## 2022-05-08 ENCOUNTER — Ambulatory Visit (INDEPENDENT_AMBULATORY_CARE_PROVIDER_SITE_OTHER): Payer: No Typology Code available for payment source

## 2022-05-08 DIAGNOSIS — J309 Allergic rhinitis, unspecified: Secondary | ICD-10-CM | POA: Diagnosis not present

## 2022-05-15 ENCOUNTER — Encounter: Payer: Self-pay | Admitting: Allergy

## 2022-05-15 ENCOUNTER — Ambulatory Visit (INDEPENDENT_AMBULATORY_CARE_PROVIDER_SITE_OTHER): Payer: No Typology Code available for payment source

## 2022-05-15 ENCOUNTER — Encounter: Payer: Self-pay | Admitting: Internal Medicine

## 2022-05-15 DIAGNOSIS — J309 Allergic rhinitis, unspecified: Secondary | ICD-10-CM

## 2022-05-16 ENCOUNTER — Other Ambulatory Visit: Payer: Self-pay

## 2022-05-16 MED ORDER — FAMOTIDINE 20 MG PO TABS: 20.0000 mg | ORAL_TABLET | Freq: Two times a day (BID) | ORAL | 5 refills | Status: AC

## 2022-05-16 NOTE — Telephone Encounter (Signed)
We can start with pepcid 20mg  twice daily and I want to see him in clinic in 4 weeks to assess response.  Thanks!

## 2022-05-27 ENCOUNTER — Ambulatory Visit (INDEPENDENT_AMBULATORY_CARE_PROVIDER_SITE_OTHER): Payer: No Typology Code available for payment source

## 2022-05-27 ENCOUNTER — Encounter: Payer: Self-pay | Admitting: Allergy & Immunology

## 2022-05-27 DIAGNOSIS — J309 Allergic rhinitis, unspecified: Secondary | ICD-10-CM

## 2022-06-09 ENCOUNTER — Ambulatory Visit: Payer: No Typology Code available for payment source | Admitting: Internal Medicine

## 2022-06-12 ENCOUNTER — Encounter: Payer: Self-pay | Admitting: Allergy

## 2022-06-12 ENCOUNTER — Ambulatory Visit (INDEPENDENT_AMBULATORY_CARE_PROVIDER_SITE_OTHER): Payer: No Typology Code available for payment source

## 2022-06-12 DIAGNOSIS — J309 Allergic rhinitis, unspecified: Secondary | ICD-10-CM | POA: Diagnosis not present

## 2022-07-01 ENCOUNTER — Encounter: Payer: Self-pay | Admitting: Allergy and Immunology

## 2022-07-01 ENCOUNTER — Ambulatory Visit (INDEPENDENT_AMBULATORY_CARE_PROVIDER_SITE_OTHER): Payer: No Typology Code available for payment source

## 2022-07-01 DIAGNOSIS — J309 Allergic rhinitis, unspecified: Secondary | ICD-10-CM

## 2022-07-16 ENCOUNTER — Encounter: Payer: Self-pay | Admitting: Internal Medicine

## 2022-07-16 ENCOUNTER — Ambulatory Visit (INDEPENDENT_AMBULATORY_CARE_PROVIDER_SITE_OTHER): Payer: No Typology Code available for payment source

## 2022-07-16 DIAGNOSIS — J309 Allergic rhinitis, unspecified: Secondary | ICD-10-CM

## 2022-07-29 DIAGNOSIS — J301 Allergic rhinitis due to pollen: Secondary | ICD-10-CM | POA: Diagnosis not present

## 2022-07-29 NOTE — Progress Notes (Signed)
VIALS EXP 07-27-24

## 2022-07-30 DIAGNOSIS — J3089 Other allergic rhinitis: Secondary | ICD-10-CM | POA: Diagnosis not present

## 2022-07-31 ENCOUNTER — Ambulatory Visit (INDEPENDENT_AMBULATORY_CARE_PROVIDER_SITE_OTHER): Payer: No Typology Code available for payment source

## 2022-07-31 ENCOUNTER — Encounter: Payer: Self-pay | Admitting: Allergy

## 2022-07-31 DIAGNOSIS — J309 Allergic rhinitis, unspecified: Secondary | ICD-10-CM | POA: Diagnosis not present

## 2022-08-11 ENCOUNTER — Ambulatory Visit (INDEPENDENT_AMBULATORY_CARE_PROVIDER_SITE_OTHER): Payer: No Typology Code available for payment source | Admitting: *Deleted

## 2022-08-11 ENCOUNTER — Telehealth: Payer: Self-pay | Admitting: *Deleted

## 2022-08-11 DIAGNOSIS — J309 Allergic rhinitis, unspecified: Secondary | ICD-10-CM

## 2022-08-11 NOTE — Telephone Encounter (Signed)
Letter for [facility].

## 2022-08-19 ENCOUNTER — Ambulatory Visit (INDEPENDENT_AMBULATORY_CARE_PROVIDER_SITE_OTHER): Payer: No Typology Code available for payment source

## 2022-08-19 ENCOUNTER — Encounter: Payer: Self-pay | Admitting: Internal Medicine

## 2022-08-19 ENCOUNTER — Telehealth: Payer: Self-pay | Admitting: Internal Medicine

## 2022-08-19 DIAGNOSIS — J309 Allergic rhinitis, unspecified: Secondary | ICD-10-CM

## 2022-08-19 NOTE — Telephone Encounter (Signed)
Patient called back and I advised him of the authorization request being expired and also told patient to contact [facility] as well to get a new authorization request and that a new authorization request has been faxed as well.

## 2022-08-19 NOTE — Telephone Encounter (Signed)
Patient current authorization ([id number]) has expired on 08/13/2022 and attempted to call patient and advise.   Faxed renewal of authorization request to [facility], [phone].

## 2022-08-27 ENCOUNTER — Ambulatory Visit (INDEPENDENT_AMBULATORY_CARE_PROVIDER_SITE_OTHER): Payer: No Typology Code available for payment source

## 2022-08-27 ENCOUNTER — Encounter: Payer: Self-pay | Admitting: Family

## 2022-08-27 DIAGNOSIS — J309 Allergic rhinitis, unspecified: Secondary | ICD-10-CM | POA: Diagnosis not present

## 2022-09-03 NOTE — Patient Instructions (Incomplete)
Below is our plan:  We will consider CGRP injections (Amovig, Ajovy, Emagality) versus a tablet form of this same class of medicine Bennie Pierini). Let me know what you are most comfortable with starting.   Please make sure you are staying well hydrated. I recommend 50-60 ounces daily. Well balanced diet and regular exercise encouraged. Consistent sleep schedule with 6-8 hours recommended.   Please continue follow up with care team as directed.   Follow up with me in 6 months   You may receive a survey regarding today's visit. I encourage you to leave honest feed back as I do use this information to improve patient care. Thank you for seeing me today!   GENERAL HEADACHE INFORMATION:   Natural supplements: Magnesium Oxide or Magnesium Glycinate 500 mg at bed (up to 800 mg daily) Coenzyme Q10 300 mg in AM Vitamin B2- 200 mg twice a day   Add 1 supplement at a time since even natural supplements can have undesirable side effects. You can sometimes buy supplements cheaper (especially Coenzyme Q10) at www.WebmailGuide.co.za or at [employer].   Vitamins and herbs that show potential:   Magnesium: Magnesium (250 mg twice a day or 500 mg at bed) has a relaxant effect on smooth muscles such as blood vessels. Individuals suffering from frequent or daily headache usually have low magnesium levels which can be increase with daily supplementation of 400-750 mg. Three trials found 40-90% average headache reduction  when used as a preventative. Magnesium also demonstrated the benefit in menstrually related migraine.  Magnesium is part of the messenger system in the serotonin cascade and it is a good muscle relaxant.  It is also useful for constipation which can be a side effect of other medications used to treat migraine. Good sources include nuts, whole grains, and tomatoes. Side Effects: loose stool/diarrhea  Riboflavin (vitamin B 2) 200 mg twice a day. This vitamin assists nerve cells in the production of ATP a  principal energy storing molecule.  It is necessary for many chemical reactions in the body.  There have been at least 3 clinical trials of riboflavin using 400 mg per day all of which suggested that migraine frequency can be decreased.  All 3 trials showed significant improvement in over half of migraine sufferers.  The supplement is found in bread, cereal, milk, meat, and poultry.  Most Americans get more riboflavin than the recommended daily allowance, however riboflavin deficiency is not necessary for the supplements to help prevent headache. Side effects: energizing, green urine   Coenzyme Q10: This is present in almost all cells in the body and is critical component for the conversion of energy.  Recent studies have shown that a nutritional supplement of CoQ10 can reduce the frequency of migraine attacks by improving the energy production of cells as with riboflavin.  Doses of 150 mg twice a day have been shown to be effective.   Melatonin: Increasing evidence shows correlation between melatonin secretion and headache conditions.  Melatonin supplementation has decreased headache intensity and duration.  It is widely used as a sleep aid.  Sleep is natures way of dealing with migraine.  A dose of 3 mg is recommended to start for headaches including cluster headache. Higher doses up to 15 mg has been reviewed for use in Cluster headache and have been used. The rationale behind using melatonin for cluster is that many theories regarding the cause of Cluster headache center around the disruption of the normal circadian rhythm in the brain.  This helps restore  the normal circadian rhythm.   HEADACHE DIET: Foods and beverages which may trigger migraine Note that only 20% of headache patients are food sensitive. You will know if you are food sensitive if you get a headache consistently 20 minutes to 2 hours after eating a certain food. Only cut out a food if it causes headaches, otherwise you might remove  foods you enjoy! What matters most for diet is to eat a well balanced healthy diet full of vegetables and low fat protein, and to not miss meals.   Chocolate, other sweets ALL cheeses except cottage and cream cheese Dairy products, yogurt, sour cream, ice cream Liver Meat extracts (Bovril, Marmite, meat tenderizers) Meats or fish which have undergone aging, fermenting, pickling or smoking. These include: Hotdogs,salami,Lox,sausage, mortadellas,smoked salmon, pepperoni, Pickled herring Pods of broad bean (English beans, Chinese pea pods, [place] (fava) beans, lima and navy beans Ripe avocado, ripe banana Yeast extracts or active yeast preparations such as Brewer's or Fleishman's (commercial bakes goods are permitted) Tomato based foods, pizza (lasagna, etc.)   MSG (monosodium glutamate) is disguised as many things; look for these common aliases: Monopotassium glutamate Autolysed yeast Hydrolysed protein Sodium caseinate "flavorings" "all natural preservatives" Nutrasweet   Avoid all other foods that convincingly provoke headaches.   Resources: The Dizzy [place] Laundry Your Headache Diet, migrainestrong.com  https://zamora-andrews.com/   Caffeine and Migraine For patients that have migraine, caffeine intake more than 3 days per week can lead to dependency and increased migraine frequency. I would recommend cutting back on your caffeine intake as best you can. The recommended amount of caffeine is 200-300 mg daily, although migraine patients may experience dependency at even lower doses. While you may notice an increase in headache temporarily, cutting back will be helpful for headaches in the long run. For more information on caffeine and migraine, visit: https://americanmigrainefoundation.org/resource-library/caffeine-and-migraine/   Headache Prevention Strategies:   1. Maintain a headache diary; learn to identify and avoid triggers.  - This  can be a simple note where you log when you had a headache, associated symptoms, and medications used - There are several smartphone apps developed to help track migraines: Migraine Buddy, Migraine Monitor, Curelator N1-Headache App   Common triggers include: Emotional triggers: Emotional/Upset family or friends Emotional/Upset occupation Business reversal/success Anticipation anxiety Crisis-serious Post-crisis periodNew job/position   Physical triggers: Vacation Day Weekend Strenuous Exercise High Altitude Location New Move Menstrual Day Physical Illness Oversleep/Not enough sleep Weather changes Light: Photophobia or light sesnitivity treatment involves a balance between desensitization and reduction in overly strong input. Use dark polarized glasses outside, but not inside. Avoid bright or fluorescent light, but do not dim environment to the point that going into a normally lit room hurts. Consider FL-41 tint lenses, which reduce the most irritating wavelengths without blocking too much light.  These can be obtained at axonoptics.com or theraspecs.com Foods: see list above.   2. Limit use of acute treatments (over-the-counter medications, triptans, etc.) to no more than 2 days per week or 10 days per month to prevent medication overuse headache (rebound headache).     3. Follow a regular schedule (including weekends and holidays): Don't skip meals. Eat a balanced diet. 8 hours of sleep nightly. Minimize stress. Exercise 30 minutes per day. Being overweight is associated with a 5 times increased risk of chronic migraine. Keep well hydrated and drink 6-8 glasses of water per day.   4. Initiate non-pharmacologic measures at the earliest onset of your headache. Rest and quiet environment. Relax and reduce  stress. Breathe2Relax is a free app that can instruct you on    some simple relaxtion and breathing techniques. Http://Dawnbuse.com is a    free website that provides teaching  videos on relaxation.  Also, there are  many apps that   can be downloaded for "mindful" relaxation.  An app called YOGA NIDRA will help walk you through mindfulness. Another app called Calm can be downloaded to give you a structured mindfulness guide with daily reminders and skill development. Headspace for guided meditation Mindfulness Based Stress Reduction Online Course: www.palousemindfulness.com Cold compresses.   5. Don't wait!! Take the maximum allowable dosage of prescribed medication at the first sign of migraine.   6. Compliance:  Take prescribed medication regularly as directed and at the first sign of a migraine.   7. Communicate:  Call your physician when problems arise, especially if your headaches change, increase in frequency/severity, or become associated with neurological symptoms (weakness, numbness, slurred speech, etc.).   8. Headache/pain management therapies: Consider various complementary methods, including medication, behavioral therapy, psychological counselling, biofeedback, massage therapy, acupuncture, dry needling, and other modalities.  Such measures may reduce the need for medications. Counseling for pain management, where patients learn to function and ignore/minimize their pain, seems to work very well.   9. Recommend changing family's attention and focus away from patient's headaches. Instead, emphasize daily activities. If first question of day is 'How are your headaches/Do you have a headache today?', then patient will constantly think about headaches, thus making them worse. Goal is to re-direct attention away from headaches, toward daily activities and other distractions.   10. Helpful Websites: www.AmericanHeadacheSociety.org PatentHood.ch www.headaches.org TightMarket.nl www.achenet.org

## 2022-09-03 NOTE — Progress Notes (Signed)
No chief complaint on file.   HISTORY OF PRESENT ILLNESS:  09/03/22 ALL:  Brandon Suarez returns for follow up for headaches. He was last seen 04/2022 and reported improvement with propranolol 120mg  daily and amitriptyline 10mg  QHS.   04/29/2022 (Mychart)  Brandon Suarez returns for follow up for headaches. He was last seen 03/2021 and we advised to continue propranolol 120mg  daily and add amitriptyline 10mg  QHS. He tells me, today, that he isn't sure what he is taking. He thinks he is taking propranolol and tells me he is taking something at night but not sure if it is amitriptyline. He does feel headaches are better. He has about 12 headache days a month with about Rest helps most with migraines. He is now using [facility] for management of sleep apnea. He has not had HST on [place] but reports that his wife says he doesn't snore as much. He thinks headaches have gotten better since using this.   03/20/2021 ALL:  Brandon Suarez is a 32 y.o. male here today for follow up for headaches. He was started on propranolol at consult with Dr Lucia Gaskins 11/2020. Dose was increased to 120mg  daily 01/30/21. MRI/MRA normal. He is not sure headaches are any better. He has tolerated propranolol but continues to have hear daily headaches. He has 4-8 migrainous headaches a month.   He is followed by the [facility] for OSA. He is treated with a nasal device. He is not sure this has helped. He does not sleep well. He usually goes to bed at 12-1am and up at 4-5am. He has difficulty falling asleep. He takes melatonin but unsure of dose. He reports getting an rx through the [place]. Epic noted 6mg  daily. He continues immunotherapy for allergies.   HISTORY (copied from Dr Trevor Mace previous note)  HPI:  Brandon Suarez is a 32 y.o. male here as requested by Brandon Hakim, MD for daily headaches. PMHx sleep apnea, insomnia, depression, arthritis. He is not using his cpap. The headaches "come and go", whenever, he can be at work or sitting on the couch  watching TV, he can wake with them in the mornings, mouth is dry in the mornings, He is not sure if his sleep apnea is mild. He has constant throbbing, he can hear the throbbing, it is on both sides, sometimes in the temples, sometimes in the backer part of the back of the head, doesn't last an hour, maybe 20 minutes, a cool area helps, noise makes it worse, bright lights make it worse, ibuprofen may help a little. And ice pak helps. No nausea or vomiting. No vision changes. No other focal neurologic symptoms. They get worse bendingstooping and working out. Pulsatile tinnitus. Started a few months ago, no inciting events, no head trauma, no new medications.    Reviewed notes, labs and imaging from outside physicians, which showed:   10/07/2020: Cbc/cmp: unremarkable    Medications tried that can be used in migraine management include: Flexeril, ibuprofen, melatonin, meloxicam, naproxen, prednisone.   REVIEW OF SYSTEMS: Out of a complete 14 system review of symptoms, the patient complains only of the following symptoms, headaches, insomnia and all other reviewed systems are negative.   ALLERGIES: No Known Allergies   HOME MEDICATIONS: Outpatient Medications Prior to Visit  Medication Sig Dispense Refill   azelastine (ASTELIN) 0.1 % nasal spray Place 1-2 sprays into both nostrils 2 (two) times daily as needed (NASAL CONGESTION STUFFINESS). Use in each nostril as directed 30 mL 5   cetirizine (ZYRTEC) 10 MG tablet  Take by mouth.     clotrimazole-betamethasone (LOTRISONE) cream Apply 1 application topically 2 (two) times daily. 30 g 3   Emollient (VANICREAM) LOTN Apply 1 application topically daily as needed. 453 mL 5   Emollient (VANICREAM) OINT Apply 1 application topically daily as needed. 70 g 5   EPINEPHrine 0.3 mg/0.3 mL IJ SOAJ injection Inject 0.3 mg into the muscle as needed. 1 each 2   famotidine (PEPCID) 20 MG tablet Take 1 tablet (20 mg total) by mouth 2 (two) times daily. 60 tablet 5    fexofenadine (ALLEGRA ALLERGY) 180 MG tablet Take 1 tablet (180 mg total) by mouth daily. 30 tablet 5   fluticasone (FLONASE) 50 MCG/ACT nasal spray Place 1 spray into both nostrils 2 (two) times daily as needed (nasal congestion). 16 g 5   hydrocortisone 2.5 % cream Apply topically as needed. 28 g 5   ibuprofen (ADVIL) 800 MG tablet TAKE ONE TABLET BY MOUTH TWICE A DAY AS NEEDED (TAKE WITH FOOD)     ketoconazole (NIZORAL) 2 % cream Apply 1 Application topically daily. 15 g 0   ketoconazole (NIZORAL) 2 % shampoo Apply 1 Application topically 2 (two) times a week. 120 mL 0   melatonin 3 MG TABS tablet Take 6 mg by mouth at bedtime.     metaxalone (SKELAXIN) 800 MG tablet metaxalone 800 mg oral tablet Start Date: 06/03/19 Status: Ordered     montelukast (SINGULAIR) 10 MG tablet Take 1 tablet (10 mg total) by mouth at bedtime. TAKE IN ADDITION TO ZYRTEC (CETIRIZINE) 30 tablet 5   naproxen (NAPROSYN) 500 MG tablet      olopatadine (PATANOL) 0.1 % ophthalmic solution Place 1 drop into both eyes 2 (two) times daily as needed (itchy/watery). 5 mL 5   pantoprazole (PROTONIX) 20 MG tablet      propranolol ER (INDERAL LA) 120 MG 24 hr capsule Take 1 capsule (120 mg total) by mouth at bedtime. 30 capsule 6   Skin Protectants, Misc. (EUCERIN) cream Apply topically as needed for dry skin. 454 g 5   topiramate (TOPAMAX) 100 MG tablet Start with 1/2(50mg ) pill at bedtime and in 2 weeks take a whole pill at bedtime every night(100mg ). Discontinue Amitriptyline please. 90 tablet 4   valACYclovir (VALTREX) 1000 MG tablet 1,000 mg daily. Prn for cold sores.     No facility-administered medications prior to visit.     PAST MEDICAL HISTORY: Past Medical History:  Diagnosis Date   Allergy    Headache      PAST SURGICAL HISTORY: No past surgical history on file.   FAMILY HISTORY: Family History  Problem Relation Age of Onset   Hypertension Father    Migraines Neg Hx      SOCIAL HISTORY: Social  History   Socioeconomic History   Marital status: Married    Spouse name: Not on file   Number of children: Not on file   Years of education: Not on file   Highest education level: Not on file  Occupational History   Not on file  Tobacco Use   Smoking status: Never   Smokeless tobacco: Never  Vaping Use   Vaping Use: Never used  Substance and Sexual Activity   Alcohol use: Yes    Comment: 2   Drug use: Never   Sexual activity: Yes  Other Topics Concern   Not on file  Social History Narrative   Caffeine none, soda's none.  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  Social Determinants of Health   Financial Resource Strain: Not on file  Food Insecurity: Not on file  Transportation Needs: Not on file  Physical Activity: Not on file  Stress: Not on file  Social Connections: Not on file  Intimate Partner Violence: Not on file     PHYSICAL EXAM  There were no vitals filed for this visit.   There is no height or weight on file to calculate BMI.  Generalized: Well developed, in no acute distress  Cardiology: normal rate and rhythm, no murmur auscultated  Respiratory: clear to auscultation bilaterally    Neurological examination  Mentation: Alert oriented to time, place, history taking. Follows all commands speech and language fluent Cranial nerve II-XII: Pupils were equal round reactive to light. Extraocular movements were full, visual field were full on confrontational test. Facial sensation and strength were normal. Head turning and shoulder shrug  were normal and symmetric. Motor: The motor testing reveals 5 over 5 strength of all 4 extremities. Good symmetric motor tone is noted throughout.  Coordination: Cerebellar testing reveals good finger-nose-finger and heel-to-shin bilaterally.  Gait and station: Gait is normal.    DIAGNOSTIC DATA (LABS, IMAGING, TESTING) - I reviewed patient records, labs, notes, testing and imaging myself where available.  Lab  Results  Component Value Date   WBC 7.8 09/26/2015   HGB 15.9 09/26/2015   HCT 45.0 09/26/2015   MCV 83.4 09/26/2015   No results found for: "NA", "K", "CL", "CO2", "GLUCOSE", "BUN", "CREATININE", "CALCIUM", "PROT", "ALBUMIN", "AST", "ALT", "ALKPHOS", "BILITOT", "GFRNONAA", "GFRAA" No results found for: "CHOL", "HDL", "LDLCALC", "LDLDIRECT", "TRIG", "CHOLHDL" No results found for: "HGBA1C" No results found for: "VITAMINB12" Lab Results  Component Value Date   TSH 1.530 11/27/2020        No data to display               No data to display           ASSESSMENT AND PLAN  32 y.o. year old male  has a past medical history of Allergy and Headache. here with    No diagnosis found.  Vaughn reports headaches have decreased in frequency and intensity since last visit. He will continue propranolol 120mg  daily and amitriptyline 10mg  daily assuming he is already. May increase dose if well tolerated. He will continue ibuprofen for abortive therapy. I have asked him to call me asap to update his medication list. Will refill medications pending review of med list. Healthy lifestyle habits advised. I have [facility] device. He will return to see me in 1 year if doing well, sooner if needed.    No orders of the defined types were placed in this encounter.     No orders of the defined types were placed in this encounter.      Shawnie Dapper, MSN, FNP-C 09/03/2022, 4:30 PM  [facility] [address], [address] [place], [place] [address] [phone]

## 2022-09-04 ENCOUNTER — Ambulatory Visit (INDEPENDENT_AMBULATORY_CARE_PROVIDER_SITE_OTHER): Payer: No Typology Code available for payment source | Admitting: Family Medicine

## 2022-09-04 ENCOUNTER — Ambulatory Visit (INDEPENDENT_AMBULATORY_CARE_PROVIDER_SITE_OTHER): Payer: No Typology Code available for payment source

## 2022-09-04 ENCOUNTER — Encounter: Payer: Self-pay | Admitting: Family Medicine

## 2022-09-04 VITALS — BP 111/74 | HR 73 | Ht 66.0 in | Wt 223.2 lb

## 2022-09-04 DIAGNOSIS — G43711 Chronic migraine without aura, intractable, with status migrainosus: Secondary | ICD-10-CM | POA: Diagnosis not present

## 2022-09-04 DIAGNOSIS — R519 Headache, unspecified: Secondary | ICD-10-CM

## 2022-09-04 DIAGNOSIS — J309 Allergic rhinitis, unspecified: Secondary | ICD-10-CM

## 2022-09-12 ENCOUNTER — Ambulatory Visit (INDEPENDENT_AMBULATORY_CARE_PROVIDER_SITE_OTHER): Payer: No Typology Code available for payment source | Admitting: *Deleted

## 2022-09-12 ENCOUNTER — Encounter: Payer: Self-pay | Admitting: Allergy

## 2022-09-12 DIAGNOSIS — J309 Allergic rhinitis, unspecified: Secondary | ICD-10-CM | POA: Diagnosis not present

## 2022-09-18 ENCOUNTER — Ambulatory Visit (INDEPENDENT_AMBULATORY_CARE_PROVIDER_SITE_OTHER): Payer: No Typology Code available for payment source

## 2022-09-18 ENCOUNTER — Encounter: Payer: Self-pay | Admitting: Family Medicine

## 2022-09-18 DIAGNOSIS — J309 Allergic rhinitis, unspecified: Secondary | ICD-10-CM

## 2022-09-30 ENCOUNTER — Ambulatory Visit (INDEPENDENT_AMBULATORY_CARE_PROVIDER_SITE_OTHER): Payer: No Typology Code available for payment source | Admitting: *Deleted

## 2022-09-30 DIAGNOSIS — J309 Allergic rhinitis, unspecified: Secondary | ICD-10-CM

## 2022-10-08 ENCOUNTER — Other Ambulatory Visit: Payer: Self-pay | Admitting: *Deleted

## 2022-10-08 ENCOUNTER — Ambulatory Visit (INDEPENDENT_AMBULATORY_CARE_PROVIDER_SITE_OTHER): Payer: No Typology Code available for payment source | Admitting: *Deleted

## 2022-10-08 ENCOUNTER — Encounter: Payer: Self-pay | Admitting: Family

## 2022-10-08 DIAGNOSIS — J309 Allergic rhinitis, unspecified: Secondary | ICD-10-CM

## 2022-10-08 MED ORDER — VANICREAM EX LOTN: 1.0000 "application " | TOPICAL_LOTION | Freq: Every day | CUTANEOUS | 1 refills | Status: AC | PRN

## 2022-10-08 MED ORDER — KETOCONAZOLE 2 % EX CREA: 1.0000 | TOPICAL_CREAM | Freq: Every day | CUTANEOUS | 1 refills | Status: DC

## 2022-10-13 ENCOUNTER — Telehealth: Payer: Self-pay

## 2022-10-13 MED ORDER — HYDROPHILIC EX OINT: TOPICAL_OINTMENT | CUTANEOUS | 0 refills | Status: DC | PRN

## 2022-10-13 NOTE — Telephone Encounter (Signed)
Sent in eucerin to [facility]

## 2022-10-13 NOTE — Telephone Encounter (Signed)
Received fax - [facility] -DOB verified -   Emollient (Vanicream) Lotion prescription was DENIED.  Preferred alternatives:  Ammonium Lactate lotion, Hydrophilic ointment (Eucerin), Aquaphor, Lanolin cream and Urea cream/lotion.   Forwarding message to provider for next step.

## 2022-10-13 NOTE — Telephone Encounter (Signed)
Lets try eucerin.  Thanks!

## 2022-10-13 NOTE — Addendum Note (Signed)
Addended by: Berna Bue on: 10/13/2022 02:04 PM   Modules accepted: Orders

## 2022-10-21 ENCOUNTER — Ambulatory Visit (INDEPENDENT_AMBULATORY_CARE_PROVIDER_SITE_OTHER): Payer: No Typology Code available for payment source | Admitting: *Deleted

## 2022-10-21 DIAGNOSIS — J309 Allergic rhinitis, unspecified: Secondary | ICD-10-CM | POA: Diagnosis not present

## 2022-10-29 DIAGNOSIS — J301 Allergic rhinitis due to pollen: Secondary | ICD-10-CM | POA: Diagnosis not present

## 2022-10-29 NOTE — Progress Notes (Signed)
VIALS EXP 10-29-23 

## 2022-10-30 DIAGNOSIS — J3089 Other allergic rhinitis: Secondary | ICD-10-CM | POA: Diagnosis not present

## 2022-11-04 ENCOUNTER — Ambulatory Visit (INDEPENDENT_AMBULATORY_CARE_PROVIDER_SITE_OTHER): Payer: No Typology Code available for payment source

## 2022-11-04 DIAGNOSIS — J309 Allergic rhinitis, unspecified: Secondary | ICD-10-CM | POA: Diagnosis not present

## 2022-11-20 DIAGNOSIS — Z0289 Encounter for other administrative examinations: Secondary | ICD-10-CM

## 2022-11-25 ENCOUNTER — Encounter: Payer: Self-pay | Admitting: Allergy & Immunology

## 2022-11-25 ENCOUNTER — Telehealth: Payer: Self-pay | Admitting: *Deleted

## 2022-11-25 ENCOUNTER — Ambulatory Visit (INDEPENDENT_AMBULATORY_CARE_PROVIDER_SITE_OTHER): Payer: No Typology Code available for payment source | Admitting: *Deleted

## 2022-11-25 DIAGNOSIS — J309 Allergic rhinitis, unspecified: Secondary | ICD-10-CM

## 2022-11-25 NOTE — Telephone Encounter (Signed)
Brandon Masse, RN filled out form, Amy signed, copy made, original form given to Stanton Kidney to reach out to patient.

## 2022-11-25 NOTE — Telephone Encounter (Signed)
We received a DMV form for window tint waiver and FMLA form. Pt requests form asap. Amy, are you ok with completing both?

## 2022-12-12 ENCOUNTER — Encounter: Payer: Self-pay | Admitting: Internal Medicine

## 2022-12-12 ENCOUNTER — Ambulatory Visit (INDEPENDENT_AMBULATORY_CARE_PROVIDER_SITE_OTHER): Payer: No Typology Code available for payment source

## 2022-12-12 DIAGNOSIS — J309 Allergic rhinitis, unspecified: Secondary | ICD-10-CM

## 2022-12-23 ENCOUNTER — Ambulatory Visit (INDEPENDENT_AMBULATORY_CARE_PROVIDER_SITE_OTHER): Payer: No Typology Code available for payment source | Admitting: *Deleted

## 2022-12-23 ENCOUNTER — Encounter: Payer: Self-pay | Admitting: Allergy & Immunology

## 2022-12-23 DIAGNOSIS — J309 Allergic rhinitis, unspecified: Secondary | ICD-10-CM | POA: Diagnosis not present

## 2022-12-29 ENCOUNTER — Encounter: Payer: Self-pay | Admitting: Internal Medicine

## 2022-12-29 ENCOUNTER — Ambulatory Visit (INDEPENDENT_AMBULATORY_CARE_PROVIDER_SITE_OTHER): Payer: No Typology Code available for payment source | Admitting: *Deleted

## 2022-12-29 DIAGNOSIS — J309 Allergic rhinitis, unspecified: Secondary | ICD-10-CM

## 2023-01-13 ENCOUNTER — Ambulatory Visit (INDEPENDENT_AMBULATORY_CARE_PROVIDER_SITE_OTHER): Payer: No Typology Code available for payment source | Admitting: Family Medicine

## 2023-01-13 ENCOUNTER — Encounter: Payer: Self-pay | Admitting: Internal Medicine

## 2023-01-13 ENCOUNTER — Other Ambulatory Visit: Payer: Self-pay

## 2023-01-13 ENCOUNTER — Encounter: Payer: Self-pay | Admitting: Family Medicine

## 2023-01-13 ENCOUNTER — Ambulatory Visit: Payer: Self-pay

## 2023-01-13 VITALS — BP 128/72 | HR 96 | Temp 98.0°F | Resp 20 | Wt 220.0 lb

## 2023-01-13 DIAGNOSIS — H1013 Acute atopic conjunctivitis, bilateral: Secondary | ICD-10-CM | POA: Diagnosis not present

## 2023-01-13 DIAGNOSIS — L219 Seborrheic dermatitis, unspecified: Secondary | ICD-10-CM | POA: Diagnosis not present

## 2023-01-13 DIAGNOSIS — J3089 Other allergic rhinitis: Secondary | ICD-10-CM | POA: Diagnosis not present

## 2023-01-13 DIAGNOSIS — L509 Urticaria, unspecified: Secondary | ICD-10-CM | POA: Diagnosis not present

## 2023-01-13 DIAGNOSIS — J309 Allergic rhinitis, unspecified: Secondary | ICD-10-CM

## 2023-01-13 DIAGNOSIS — H1010 Acute atopic conjunctivitis, unspecified eye: Secondary | ICD-10-CM

## 2023-01-13 DIAGNOSIS — J302 Other seasonal allergic rhinitis: Secondary | ICD-10-CM | POA: Insufficient documentation

## 2023-01-13 MED ORDER — EPINEPHRINE 0.3 MG/0.3ML IJ SOAJ: 0.3000 mg | INTRAMUSCULAR | 2 refills | Status: DC | PRN

## 2023-01-13 MED ORDER — FEXOFENADINE HCL 180 MG PO TABS: 180.0000 mg | ORAL_TABLET | Freq: Every day | ORAL | 5 refills | Status: DC

## 2023-01-13 MED ORDER — KETOCONAZOLE 2 % EX CREA: 1.0000 | TOPICAL_CREAM | Freq: Every day | CUTANEOUS | 1 refills | Status: AC

## 2023-01-13 MED ORDER — KETOCONAZOLE 2 % EX SHAM: 1.0000 | MEDICATED_SHAMPOO | CUTANEOUS | 3 refills | Status: DC

## 2023-01-13 NOTE — Patient Instructions (Addendum)
Allergic rhinitis Continue allergen avoidance measures directed toward pollens, mold, cockroach, and dust mite as listed below Increase montelukast to 10 mg once a day for control of allergy symptoms Continue Allegra 180 mg once a day as needed for runny nose or itch Continue Flonase 2 sprays in each nostril once a day as needed for stuffy nose Consider saline nasal rinses as needed for nasal symptoms. Use this before any medicated nasal sprays for best result Begin a nasal gel as needed for dry nostrils Continue allergen immunotherapy and have access to an epinephrine autoinjector set per protocol  Allergic conjunctivitis Some over the counter eye drops include Pataday one drop in each eye once a day as needed for red, itchy eyes OR Zaditor one drop in each eye twice a day as needed for red itchy eyes. Avoid eye drops that say red eye relief as they may contain medications that dry out your eyes.   Hives (urticaria) Use the lease amount of medications while remaining hive free Allegra 180 (fexofenadine) 180 mg twice a day and famotidine (Pepcid) 20 mg twice a day. If no symptoms for 7-14 days then decrease to. Allegra (fexofenadine) twice 180 mg a day and famotidine (Pepcid) 20 mg once a day.  If no symptoms for 7-14 days then decrease to. Allegra (fexofenadine)180 mg  twice a day.  If no symptoms for 7-14 days then decrease to. Allegra (fexofenadine)180 mg once a day.  May use Benadryl (diphenhydramine) as needed for breakthrough hives       If symptoms return, then step up dosage  Keep a detailed symptom journal including foods eaten, contact with allergens, medications taken, weather changes.    If your symptoms re-occur, begin a journal of events that occurred for up to 6 hours before your symptoms began including foods and beverages consumed, soaps or perfumes you had contact with, and medications.   Seborrheic dermatitis Continue ketoconazole 2% cream once a day as needed Continue  ketoconazole 2% shampoo 1 application up to twice a week.  Call the clinic if this treatment plan is not working well for you.  Follow up in 1 year or sooner if needed.  Reducing Pollen Exposure The American Academy of Allergy, Asthma and Immunology suggests the following steps to reduce your exposure to pollen during allergy seasons. Do not hang sheets or clothing out to dry; pollen may collect on these items. Do not mow lawns or spend time around freshly cut grass; mowing stirs up pollen. Keep windows closed at night.  Keep car windows closed while driving. Minimize morning activities outdoors, a time when pollen counts are usually at their highest. Stay indoors as much as possible when pollen counts or humidity is high and on windy days when pollen tends to remain in the air longer. Use air conditioning when possible.  Many air conditioners have filters that trap the pollen spores. Use a HEPA room air filter to remove pollen form the indoor air you breathe.  Control of Mold Allergen Mold and fungi can grow on a variety of surfaces provided certain temperature and moisture conditions exist.  Outdoor molds grow on plants, decaying vegetation and soil.  The major outdoor mold, Alternaria and Cladosporium, are found in very high numbers during hot and dry conditions.  Generally, a late Summer - Fall peak is seen for common outdoor fungal spores.  Rain will temporarily lower outdoor mold spore count, but counts rise rapidly when the rainy period ends.  The most important indoor molds are  Aspergillus and Penicillium.  Dark, humid and poorly ventilated basements are ideal sites for mold growth.  The next most common sites of mold growth are the bathroom and the kitchen.  Outdoor [employer] Use air conditioning and keep windows closed Avoid exposure to decaying vegetation. Avoid leaf raking. Avoid grain handling. Consider wearing a face mask if working in moldy areas.  Indoor Mold  Control Maintain humidity below 50%. Clean washable surfaces with 5% bleach solution. Remove sources e.g. Contaminated carpets.   Control of Dust Mite Allergen Dust mites play a major role in allergic asthma and rhinitis. They occur in environments with high humidity wherever human skin is found. Dust mites absorb humidity from the atmosphere (ie, they do not drink) and feed on organic matter (including shed human and animal skin). Dust mites are a microscopic type of insect that you cannot see with the naked eye. High levels of dust mites have been detected from mattresses, pillows, carpets, upholstered furniture, bed covers, clothes, soft toys and any woven material. The principal allergen of the dust mite is found in its feces. A gram of dust may contain 1,000 mites and 250,000 fecal particles. Mite antigen is easily measured in the air during house cleaning activities. Dust mites do not bite and do not cause harm to humans, other than by triggering allergies/asthma.  Ways to decrease your exposure to dust mites in your home:  1. Encase mattresses, box springs and pillows with a mite-impermeable barrier or cover  2. Wash sheets, blankets and drapes weekly in hot water (130 F) with detergent and dry them in a dryer on the hot setting.  3. Have the room cleaned frequently with a vacuum cleaner and a damp dust-mop. For carpeting or rugs, vacuuming with a vacuum cleaner equipped with a high-efficiency particulate air (HEPA) filter. The dust mite allergic individual should not be in a room which is being cleaned and should wait 1 hour after cleaning before going into the room.  4. Do not sleep on upholstered furniture (eg, couches).  5. If possible removing carpeting, upholstered furniture and drapery from the home is ideal. Horizontal blinds should be eliminated in the rooms where the person spends the most time (bedroom, study, television room). Washable vinyl, roller-type shades are  optimal.  6. Remove all non-washable stuffed toys from the bedroom. Wash stuffed toys weekly like sheets and blankets above.  7. Reduce indoor humidity to less than 50%. Inexpensive humidity monitors can be purchased at most hardware stores. Do not use a humidifier as can make the problem worse and are not recommended.  Control of Cockroach Allergen Cockroach allergen has been identified as an important cause of acute attacks of asthma, especially in urban settings.  There are fifty-five species of cockroach that exist in the [place], however only three, the [place], [place] species produce allergen that can affect patients with Asthma.  Allergens can be obtained from fecal particles, egg casings and secretions from cockroaches.    Remove food sources. Reduce access to water. Seal access and entry points. Spray runways with 0.5-1% Diazinon or Chlorpyrifos Blow boric acid power under stoves and refrigerator. Place bait stations (hydramethylnon) at feeding sites.

## 2023-01-13 NOTE — Progress Notes (Signed)
[address] [place] [address] Dept: [phone]  FOLLOW UP NOTE  Patient ID: Brandon Suarez, male    DOB: Oct 23, 1990  Age: 32 y.o. MRN: [id number] Date of Office Visit: 01/13/2023  Assessment  Chief Complaint: Follow-up (Yearly check up)  HPI Brandon Suarez is a 32 year old male who presents to the clinic for a follow up visit. Brandon Suarez was last seen in this clinic on 03/04/2022 by Dr. Marlynn Perking for evaluation of allergic rhinitis on allergen immunotherapy, allergic conjunctivitis, and seborrheic dermatitis.  At today's visit, Brandon Suarez reports his allergic rhinitis has been moderately well controlled with symptoms including nasal congestion, occasional sneezing, and post nasal drainage. Brandon Suarez continues Allegra 180 mg once a day, montelukast occasionally, and uses Flonase about every other day. Brandon Suarez is not currently using nasal saline rinses. Brandon Suarez continues allergen immunotherapy directed toward pollens, mold, cockroach and dust mites with no large or local reactions. Brandon Suarez reports a significant decrease in his symptoms of allergic rhinitis while continuing on allergen immunotherapy. Epinephrine auto-injector set expires next month.   Allergic conjunctivitis is reported as moderately well controlled with occasional red and itchy eyes for which Brandon Suarez uses Visine get the red out formulation.   Brandon Suarez continues to experience scaly dandruff in his hair, eyebrows, and mustache for which Brandon Suarez uses ketoconazole shampoo about once a week with relief of symptoms.   Brandon Suarez reports a new symptom of occasional hives that mainly occur on his arms and last for about 2-5 days. Brandon Suarez continues Allegra once a day with moderate relief of symptoms. Brandon Suarez denies concomitant cardiopulmonary or gastrointestinal symptoms with these hives.   His current medications are listed in the chart.  Drug Allergies:  No Known Allergies  Physical Exam: BP 128/72 (BP Location: Left Arm, Patient Position: Sitting, Cuff Size: Normal)   Pulse 96   Temp 98  F (36.7 C) (Temporal)   Resp 20   Wt 220 lb (99.8 kg)   SpO2 97%   BMI 35.51 kg/m    Physical Exam Vitals reviewed.  Constitutional:      Appearance: Normal appearance.  HENT:     Head: Normocephalic and atraumatic.     Right Ear: Tympanic membrane normal.     Left Ear: Tympanic membrane normal.     Nose:     Comments: Bilateral nares slightly erythematous with thin clear nasal drainage noted. Pharynx normal. Ears normal. Eyes normal    Mouth/Throat:     Pharynx: Oropharynx is clear.  Eyes:     Conjunctiva/sclera: Conjunctivae normal.  Cardiovascular:     Rate and Rhythm: Normal rate and regular rhythm.     Heart sounds: Normal heart sounds. No murmur heard. Pulmonary:     Effort: Pulmonary effort is normal.     Breath sounds: Normal breath sounds.     Comments: Lungs clear to auscultation Musculoskeletal:        General: Normal range of motion.     Cervical back: Normal range of motion and neck supple.  Skin:    General: Skin is warm and dry.     Comments: Some flakes noted on scalp and in eyebrows  Neurological:     Mental Status: Brandon Suarez is alert and oriented to person, place, and time.  Psychiatric:        Mood and Affect: Mood normal.        Behavior: Behavior normal.        Thought Content: Thought content normal.  Judgment: Judgment normal.     Assessment and Plan: 1. Seasonal and perennial allergic rhinitis   2. Seasonal allergic conjunctivitis   3. Seborrheic dermatitis   4. Hives     Meds ordered this encounter  Medications   EPINEPHrine 0.3 mg/0.3 mL IJ SOAJ injection    Sig: Inject 0.3 mg into the muscle as needed.    Dispense:  1 each    Refill:  2    May dispense generic/Mylan/Teva brand.   fexofenadine (ALLEGRA ALLERGY) 180 MG tablet    Sig: Take 1 tablet (180 mg total) by mouth daily.    Dispense:  30 tablet    Refill:  5   ketoconazole (NIZORAL) 2 % shampoo    Sig: Apply 1 Application topically 2 (two) times a week.    Dispense:  120  mL    Refill:  3   ketoconazole (NIZORAL) 2 % cream    Sig: Apply 1 Application topically daily.    Dispense:  15 g    Refill:  1    Patient Instructions  Allergic rhinitis Continue allergen avoidance measures directed toward pollens, mold, cockroach, and dust mite as listed below Increase montelukast to 10 mg once a day for control of allergy symptoms Continue Allegra 180 mg once a day as needed for runny nose or itch Continue Flonase 2 sprays in each nostril once a day as needed for stuffy nose Consider saline nasal rinses as needed for nasal symptoms. Use this before any medicated nasal sprays for best result Begin a nasal gel as needed for dry nostrils Continue allergen immunotherapy and have access to an epinephrine autoinjector set per protocol  Allergic conjunctivitis Some over the counter eye drops include Pataday one drop in each eye once a day as needed for red, itchy eyes OR Zaditor one drop in each eye twice a day as needed for red itchy eyes. Avoid eye drops that say red eye relief as they may contain medications that dry out your eyes.   Hives (urticaria) Use the lease amount of medications while remaining hive free Allegra 180 (fexofenadine) 180 mg twice a day and famotidine (Pepcid) 20 mg twice a day. If no symptoms for 7-14 days then decrease to. Allegra (fexofenadine) twice 180 mg a day and famotidine (Pepcid) 20 mg once a day.  If no symptoms for 7-14 days then decrease to. Allegra (fexofenadine)180 mg  twice a day.  If no symptoms for 7-14 days then decrease to. Allegra (fexofenadine)180 mg once a day.  May use Benadryl (diphenhydramine) as needed for breakthrough hives       If symptoms return, then step up dosage  Keep a detailed symptom journal including foods eaten, contact with allergens, medications taken, weather changes.    If your symptoms re-occur, begin a journal of events that occurred for up to 6 hours before your symptoms began including foods and  beverages consumed, soaps or perfumes you had contact with, and medications.   Seborrheic dermatitis Continue ketoconazole 2% cream once a day as needed Continue ketoconazole 2% shampoo 1 application up to twice a week.  Call the clinic if this treatment plan is not working well for you.  Follow up in 1 year or sooner if needed.   Return in about 1 year (around 01/13/2024), or if symptoms worsen or fail to improve.    Thank you for the opportunity to care for this patient.  Please do not hesitate to contact me with questions.  Thermon Leyland, FNP  [facility]

## 2023-01-20 ENCOUNTER — Ambulatory Visit (INDEPENDENT_AMBULATORY_CARE_PROVIDER_SITE_OTHER): Payer: No Typology Code available for payment source | Admitting: *Deleted

## 2023-01-20 ENCOUNTER — Encounter: Payer: Self-pay | Admitting: Allergy & Immunology

## 2023-01-20 DIAGNOSIS — J309 Allergic rhinitis, unspecified: Secondary | ICD-10-CM

## 2023-01-28 ENCOUNTER — Ambulatory Visit (INDEPENDENT_AMBULATORY_CARE_PROVIDER_SITE_OTHER): Payer: No Typology Code available for payment source

## 2023-01-28 ENCOUNTER — Encounter: Payer: Self-pay | Admitting: Internal Medicine

## 2023-01-28 ENCOUNTER — Encounter: Payer: Self-pay | Admitting: Family Medicine

## 2023-01-28 DIAGNOSIS — J309 Allergic rhinitis, unspecified: Secondary | ICD-10-CM | POA: Diagnosis not present

## 2023-01-28 NOTE — Telephone Encounter (Signed)
Can you please have the patient's driver submit fmla paperwork. Thank you

## 2023-01-28 NOTE — Telephone Encounter (Signed)
Driver.

## 2023-01-28 NOTE — Telephone Encounter (Signed)
He is having mobility problems and has a driver to get him to his injection appointments and she needs fmla to be able to leave her job to get him and bring him for injections

## 2023-01-29 NOTE — Telephone Encounter (Signed)
Forms completed and waiting on your signature in [place] tomorrow

## 2023-01-29 NOTE — Telephone Encounter (Signed)
No I have them here in [place] do you want me to scan and email them to you and you can email them back?

## 2023-01-29 NOTE — Telephone Encounter (Signed)
Hey there Lyla Son. Thank you for getting these ready. Are they in the [place] office?

## 2023-01-29 NOTE — Telephone Encounter (Signed)
Sure. That sounds great. Thank you

## 2023-01-29 NOTE — Telephone Encounter (Signed)
Forms printed and filled out waiting on annes signature

## 2023-01-29 NOTE — Telephone Encounter (Signed)
Emailed them to you

## 2023-01-30 NOTE — Telephone Encounter (Signed)
Signed and returned

## 2023-01-30 NOTE — Telephone Encounter (Signed)
Forms waiting at front desk for pt to pick up

## 2023-02-03 ENCOUNTER — Ambulatory Visit (INDEPENDENT_AMBULATORY_CARE_PROVIDER_SITE_OTHER): Payer: No Typology Code available for payment source | Admitting: *Deleted

## 2023-02-03 ENCOUNTER — Encounter: Payer: Self-pay | Admitting: Allergy & Immunology

## 2023-02-03 DIAGNOSIS — J309 Allergic rhinitis, unspecified: Secondary | ICD-10-CM | POA: Diagnosis not present

## 2023-02-19 ENCOUNTER — Encounter: Payer: Self-pay | Admitting: Allergy

## 2023-02-19 ENCOUNTER — Ambulatory Visit (INDEPENDENT_AMBULATORY_CARE_PROVIDER_SITE_OTHER): Payer: No Typology Code available for payment source | Admitting: *Deleted

## 2023-02-19 DIAGNOSIS — J309 Allergic rhinitis, unspecified: Secondary | ICD-10-CM | POA: Diagnosis not present

## 2023-03-10 ENCOUNTER — Encounter: Payer: Self-pay | Admitting: Internal Medicine

## 2023-03-10 ENCOUNTER — Ambulatory Visit (INDEPENDENT_AMBULATORY_CARE_PROVIDER_SITE_OTHER): Payer: No Typology Code available for payment source | Admitting: *Deleted

## 2023-03-10 DIAGNOSIS — J309 Allergic rhinitis, unspecified: Secondary | ICD-10-CM

## 2023-04-09 ENCOUNTER — Encounter: Payer: Self-pay | Admitting: Allergy & Immunology

## 2023-04-09 ENCOUNTER — Ambulatory Visit (INDEPENDENT_AMBULATORY_CARE_PROVIDER_SITE_OTHER): Payer: No Typology Code available for payment source | Admitting: *Deleted

## 2023-04-09 DIAGNOSIS — J309 Allergic rhinitis, unspecified: Secondary | ICD-10-CM | POA: Diagnosis not present

## 2023-04-13 NOTE — Progress Notes (Unsigned)
No chief complaint on file.   HISTORY OF PRESENT ILLNESS:  04/13/23 ALL:  Brandon Suarez returns for follow up for migraines. He was last seen 09/2022 and reported daily headaches with 4-8 migrainous headaches per month. He was hesitant to start new prevention plan and wished to continue Excedrin PRN for abortive therapy. Since,   [place] device?   09/04/2022 ALL:  Brandon Suarez returns for follow up for headaches. He was last seen by Dr Lucia Gaskins 04/2022 and reported continued daily headaches on propranolol 120mg  daily and amitriptyline 10mg  QHS. She advised switching amitriptyline to topiramate 100mg  daily. He did not feel topiramate was helpful. He took this for about 30 days then discontinued. He reports running out of propranolol and did not refill. He doesn't feel it was helping. He is having nearly daily headaches. Usually unilateral throbbing/pounding. He does have light sensitivity and nausea. Activity makes them worse. 4-8 per month are severe. He may take an Excedrin at times but usually doesn't like to take medicaitons. Rest helps. He continues to treat OSA with [facility] device through [place]. He is treated for allergies with immunotherapy every other week, Allegra and Singulair. He knows anxiety is a trigger for him.   Medications tried that can be used in migraine management include: Flexeril, ibuprofen, melatonin, meloxicam, naproxen, prednisone, amitriptyline, topiramate, trazodone, propranolol   04/29/2022 AA (Mychart): Still having daily headache/migraines on the propranol and on the amitriptyline(side effects making him tired).His blood pressure is "almost perfect". He is very tired. Will change amitriptyline to Topiramate. If that doesn't work then we can try some of the new medications such as Ajovy or emgality or go straight to Botox for migraines. Normal mri brain and mra head 12/2020. He has depression and is on an anti-depressant but doesn't know what. Discussed options at length.   09/03/2021  ALL: Brandon Suarez returns for follow up for headaches. He was last seen 03/2021 and we advised to continue propranolol 120mg  daily and add amitriptyline 10mg  QHS. He tells me, today, that he isn't sure what he is taking. He thinks he is taking propranolol and tells me he is taking something at night but not sure if it is amitriptyline. He does feel headaches are better. He has about 12 headache days a month with about Rest helps most with migraines. He is now using [facility] for management of sleep apnea. He has not had HST on [place] but reports that his wife says he doesn't snore as much. He thinks headaches have gotten better since using this.   03/20/2021 ALL:  Brandon Suarez is a 32 y.o. male here today for follow up for headaches. He was started on propranolol at consult with Dr Lucia Gaskins 11/2020. Dose was increased to 120mg  daily 01/30/21. MRI/MRA normal. He is not sure headaches are any better. He has tolerated propranolol but continues to have hear daily headaches. He has 4-8 migrainous headaches a month.   He is followed by the [facility] for OSA. He is treated with a nasal device. He is not sure this has helped. He does not sleep well. He usually goes to bed at 12-1am and up at 4-5am. He has difficulty falling asleep. He takes melatonin but unsure of dose. He reports getting an rx through the [place]. Epic noted 6mg  daily. He continues immunotherapy for allergies.   HISTORY (copied from Dr Trevor Mace previous note)  HPI:  Brandon Suarez is a 32 y.o. male here as requested by Jana Hakim, MD for daily headaches. PMHx sleep apnea, insomnia, depression,  arthritis. He is not using his cpap. The headaches "come and go", whenever, he can be at work or sitting on the couch watching TV, he can wake with them in the mornings, mouth is dry in the mornings, He is not sure if his sleep apnea is mild. He has constant throbbing, he can hear the throbbing, it is on both sides, sometimes in the temples, sometimes in the backer part of the  back of the head, doesn't last an hour, maybe 20 minutes, a cool area helps, noise makes it worse, bright lights make it worse, ibuprofen may help a little. And ice pak helps. No nausea or vomiting. No vision changes. No other focal neurologic symptoms. They get worse bendingstooping and working out. Pulsatile tinnitus. Started a few months ago, no inciting events, no head trauma, no new medications.    Reviewed notes, labs and imaging from outside physicians, which showed:   10/07/2020: Cbc/cmp: unremarkable    Medications tried that can be used in migraine management include: Flexeril, ibuprofen, melatonin, meloxicam, naproxen, prednisone.   REVIEW OF SYSTEMS: Out of a complete 14 system review of symptoms, the patient complains only of the following symptoms, headaches, insomnia and all other reviewed systems are negative.   ALLERGIES: No Known Allergies   HOME MEDICATIONS: Outpatient Medications Prior to Visit  Medication Sig Dispense Refill   azelastine (ASTELIN) 0.1 % nasal spray Place 1-2 sprays into both nostrils 2 (two) times daily as needed (NASAL CONGESTION STUFFINESS). Use in each nostril as directed 30 mL 5   Emollient (VANICREAM) LOTN Apply 1 application  topically daily as needed. 453 mL 1   Emollient (VANICREAM) OINT Apply 1 application topically daily as needed. 70 g 5   EPINEPHrine 0.3 mg/0.3 mL IJ SOAJ injection Inject 0.3 mg into the muscle as needed. 1 each 2   famotidine (PEPCID) 20 MG tablet Take 1 tablet (20 mg total) by mouth 2 (two) times daily. 60 tablet 5   fexofenadine (ALLEGRA ALLERGY) 180 MG tablet Take 1 tablet (180 mg total) by mouth daily. 30 tablet 5   fluticasone (FLONASE) 50 MCG/ACT nasal spray Place 1 spray into both nostrils 2 (two) times daily as needed (nasal congestion). 16 g 5   hydrocortisone 2.5 % cream Apply topically as needed. 28 g 5   hydrophilic ointment Apply topically as needed for dry skin. 454 g 0   ibuprofen (ADVIL) 800 MG tablet TAKE  ONE TABLET BY MOUTH TWICE A DAY AS NEEDED (TAKE WITH FOOD)     ketoconazole (NIZORAL) 2 % cream Apply 1 Application topically daily. 15 g 1   ketoconazole (NIZORAL) 2 % shampoo Apply 1 Application topically 2 (two) times a week. 120 mL 3   melatonin 3 MG TABS tablet Take 6 mg by mouth at bedtime. (Patient not taking: Reported on 09/04/2022)     metaxalone (SKELAXIN) 800 MG tablet metaxalone 800 mg oral tablet Start Date: 06/03/19 Status: Ordered     montelukast (SINGULAIR) 10 MG tablet Take 1 tablet (10 mg total) by mouth at bedtime. TAKE IN ADDITION TO ZYRTEC (CETIRIZINE) 30 tablet 5   naproxen (NAPROSYN) 500 MG tablet      olopatadine (PATANOL) 0.1 % ophthalmic solution Place 1 drop into both eyes 2 (two) times daily as needed (itchy/watery). 5 mL 5   pantoprazole (PROTONIX) 20 MG tablet      propranolol ER (INDERAL LA) 120 MG 24 hr capsule Take 1 capsule (120 mg total) by mouth at bedtime. 30 capsule 6  Skin Protectants, Misc. (EUCERIN) cream Apply topically as needed for dry skin. 454 g 5   topiramate (TOPAMAX) 100 MG tablet Start with 1/2(50mg ) pill at bedtime and in 2 weeks take a whole pill at bedtime every night(100mg ). Discontinue Amitriptyline please. (Patient not taking: Reported on 09/04/2022) 90 tablet 4   valACYclovir (VALTREX) 1000 MG tablet 1,000 mg daily. Prn for cold sores.     No facility-administered medications prior to visit.     PAST MEDICAL HISTORY: Past Medical History:  Diagnosis Date   Allergy    Headache      PAST SURGICAL HISTORY: No past surgical history on file.   FAMILY HISTORY: Family History  Problem Relation Age of Onset   Hypertension Father    Migraines Neg Hx      SOCIAL HISTORY: Social History   Socioeconomic History   Marital status: Married    Spouse name: Not on file   Number of children: Not on file   Years of education: Not on file   Highest education level: Not on file  Occupational History   Not on file  Tobacco Use    Smoking status: Never   Smokeless tobacco: Never  Vaping Use   Vaping status: Never Used  Substance and Sexual Activity   Alcohol use: Yes    Comment: 2   Drug use: Never   Sexual activity: Yes  Other Topics Concern   Not on file  Social History Narrative   Caffeine none, soda's none.  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     Social Determinants of Health   Financial Resource Strain: Not on file  Food Insecurity: Not on file  Transportation Needs: Not on file  Physical Activity: Not on file  Stress: Not on file  Social Connections: Unknown (09/03/2021)   [facility]   Social Network    Social Network: Not on file  Intimate Partner Violence: Unknown (08/09/2021)   Received from [facility], [facility]   HITS    Physically Hurt: Not on file    Insult or Talk Down To: Not on file    Threaten Physical Harm: Not on file    Scream or Curse: Not on file     PHYSICAL EXAM  There were no vitals filed for this visit.    There is no height or weight on file to calculate BMI.  Generalized: Well developed, in no acute distress  Cardiology: normal rate and rhythm, no murmur auscultated  Respiratory: clear to auscultation bilaterally    Neurological examination  Mentation: Alert oriented to time, place, history taking. Follows all commands speech and language fluent Cranial nerve II-XII: Pupils were equal round reactive to light. Extraocular movements were full, visual field were full on confrontational test. Facial sensation and strength were normal. Head turning and shoulder shrug  were normal and symmetric. Motor: The motor testing reveals 5 over 5 strength of all 4 extremities. Good symmetric motor tone is noted throughout.  Coordination: Cerebellar testing reveals good finger-nose-finger and heel-to-shin bilaterally.  Gait and station: Gait is normal.    DIAGNOSTIC DATA (LABS, IMAGING, TESTING) - I reviewed patient records, labs,  notes, testing and imaging myself where available.  Lab Results  Component Value Date   WBC 7.8 09/26/2015   HGB 15.9 09/26/2015   HCT 45.0 09/26/2015   MCV 83.4 09/26/2015   No results found for: "NA", "K", "CL", "CO2", "GLUCOSE", "BUN", "CREATININE", "CALCIUM", "PROT", "ALBUMIN", "AST", "ALT", "ALKPHOS", "BILITOT", "GFRNONAA", "  GFRAA" No results found for: "CHOL", "HDL", "LDLCALC", "LDLDIRECT", "TRIG", "CHOLHDL" No results found for: "HGBA1C" No results found for: "VITAMINB12" Lab Results  Component Value Date   TSH 1.530 11/27/2020        No data to display               No data to display           ASSESSMENT AND PLAN  32 y.o. year old male  has a past medical history of Allergy and Headache. here with    No diagnosis found.  Parish reports headaches worsened since last visit. He is hesitant to consider adding more medicaitons at this time. We have discussed option to try CGRP injections versus CGRP tablet. Botox also discussed. He prefers to discuss with his wife. He is aware to reach out to me if he wishes to start prevention meds. He is not using abortive meds at this time. May use Excedrin sparingly if needed. Discussed prescription abortive meds in the future. Healthy lifestyle habits advised. [facility] device. He will return to see me in 1 year if doing well, sooner if needed.     No orders of the defined types were placed in this encounter.     No orders of the defined types were placed in this encounter.      Shawnie Dapper, MSN, FNP-C 04/13/2023, 8:00 AM  [facility] [address] [place], [place] [address] [phone]

## 2023-04-13 NOTE — Patient Instructions (Incomplete)

## 2023-04-14 ENCOUNTER — Ambulatory Visit: Payer: No Typology Code available for payment source | Admitting: Family Medicine

## 2023-05-07 ENCOUNTER — Encounter: Payer: Self-pay | Admitting: Allergy & Immunology

## 2023-05-07 ENCOUNTER — Ambulatory Visit (INDEPENDENT_AMBULATORY_CARE_PROVIDER_SITE_OTHER): Payer: No Typology Code available for payment source

## 2023-05-07 DIAGNOSIS — J309 Allergic rhinitis, unspecified: Secondary | ICD-10-CM | POA: Diagnosis not present

## 2023-05-13 DIAGNOSIS — J301 Allergic rhinitis due to pollen: Secondary | ICD-10-CM | POA: Diagnosis not present

## 2023-05-14 DIAGNOSIS — J3089 Other allergic rhinitis: Secondary | ICD-10-CM | POA: Diagnosis not present

## 2023-05-14 NOTE — Progress Notes (Signed)
 VIALS EXP 05-13-24

## 2023-06-09 ENCOUNTER — Ambulatory Visit (INDEPENDENT_AMBULATORY_CARE_PROVIDER_SITE_OTHER): Payer: No Typology Code available for payment source

## 2023-06-09 ENCOUNTER — Encounter: Payer: Self-pay | Admitting: Allergy & Immunology

## 2023-06-09 DIAGNOSIS — J309 Allergic rhinitis, unspecified: Secondary | ICD-10-CM

## 2023-07-14 ENCOUNTER — Ambulatory Visit (INDEPENDENT_AMBULATORY_CARE_PROVIDER_SITE_OTHER): Admitting: *Deleted

## 2023-07-14 ENCOUNTER — Encounter: Payer: Self-pay | Admitting: Internal Medicine

## 2023-07-14 DIAGNOSIS — J309 Allergic rhinitis, unspecified: Secondary | ICD-10-CM | POA: Diagnosis not present

## 2023-08-04 ENCOUNTER — Telehealth: Payer: Self-pay

## 2023-08-04 ENCOUNTER — Ambulatory Visit (INDEPENDENT_AMBULATORY_CARE_PROVIDER_SITE_OTHER)

## 2023-08-04 DIAGNOSIS — J309 Allergic rhinitis, unspecified: Secondary | ICD-10-CM

## 2023-08-04 NOTE — Telephone Encounter (Signed)
 Patients auth exp 08/19/2023. New auth request has been sent to the [facility] via fax today. Patient is aware of this information. The injection room is aware also.

## 2023-08-05 ENCOUNTER — Encounter: Payer: Self-pay | Admitting: Internal Medicine

## 2023-08-12 ENCOUNTER — Encounter: Payer: Self-pay | Admitting: Internal Medicine

## 2023-08-12 ENCOUNTER — Ambulatory Visit (INDEPENDENT_AMBULATORY_CARE_PROVIDER_SITE_OTHER)

## 2023-08-12 DIAGNOSIS — J309 Allergic rhinitis, unspecified: Secondary | ICD-10-CM

## 2023-08-18 ENCOUNTER — Encounter: Payer: Self-pay | Admitting: Internal Medicine

## 2023-08-18 ENCOUNTER — Ambulatory Visit (INDEPENDENT_AMBULATORY_CARE_PROVIDER_SITE_OTHER): Payer: Self-pay

## 2023-08-18 DIAGNOSIS — J309 Allergic rhinitis, unspecified: Secondary | ICD-10-CM | POA: Diagnosis not present

## 2023-09-08 ENCOUNTER — Ambulatory Visit (INDEPENDENT_AMBULATORY_CARE_PROVIDER_SITE_OTHER)

## 2023-09-08 ENCOUNTER — Encounter: Payer: Self-pay | Admitting: Internal Medicine

## 2023-09-08 DIAGNOSIS — J309 Allergic rhinitis, unspecified: Secondary | ICD-10-CM

## 2023-10-06 ENCOUNTER — Encounter: Payer: Self-pay | Admitting: Internal Medicine

## 2023-10-06 ENCOUNTER — Ambulatory Visit (INDEPENDENT_AMBULATORY_CARE_PROVIDER_SITE_OTHER)

## 2023-10-06 DIAGNOSIS — J309 Allergic rhinitis, unspecified: Secondary | ICD-10-CM

## 2023-10-14 ENCOUNTER — Telehealth: Payer: Self-pay | Admitting: Internal Medicine

## 2023-10-14 NOTE — Telephone Encounter (Signed)
 Brandon Suarez called and stated that he is in need of a refill of Hydrophillic Ointment. I informed him he would probably need to schedule an OV as it has been a year since the medication was prescribed. He asked that I inquire about a refill anyway, and he will only schedule an OV if he has to. Best contact [phone]

## 2023-10-15 MED ORDER — HYDROPHILIC EX OINT: TOPICAL_OINTMENT | CUTANEOUS | 0 refills | Status: AC | PRN

## 2023-11-20 ENCOUNTER — Ambulatory Visit (INDEPENDENT_AMBULATORY_CARE_PROVIDER_SITE_OTHER): Payer: Self-pay

## 2023-11-20 ENCOUNTER — Encounter: Payer: Self-pay | Admitting: Family

## 2023-11-20 DIAGNOSIS — J309 Allergic rhinitis, unspecified: Secondary | ICD-10-CM | POA: Diagnosis not present

## 2023-12-16 ENCOUNTER — Other Ambulatory Visit: Payer: Self-pay

## 2023-12-16 ENCOUNTER — Encounter: Payer: Self-pay | Admitting: Internal Medicine

## 2023-12-16 ENCOUNTER — Ambulatory Visit (INDEPENDENT_AMBULATORY_CARE_PROVIDER_SITE_OTHER): Payer: Self-pay

## 2023-12-16 DIAGNOSIS — J309 Allergic rhinitis, unspecified: Secondary | ICD-10-CM

## 2023-12-16 MED ORDER — FEXOFENADINE HCL 180 MG PO TABS: 180.0000 mg | ORAL_TABLET | Freq: Every day | ORAL | 5 refills | Status: AC

## 2023-12-17 ENCOUNTER — Ambulatory Visit: Admitting: Internal Medicine

## 2023-12-29 ENCOUNTER — Encounter: Payer: Self-pay | Admitting: Internal Medicine

## 2023-12-29 ENCOUNTER — Other Ambulatory Visit: Payer: Self-pay

## 2023-12-29 ENCOUNTER — Ambulatory Visit: Admitting: Internal Medicine

## 2023-12-29 ENCOUNTER — Ambulatory Visit (INDEPENDENT_AMBULATORY_CARE_PROVIDER_SITE_OTHER): Admitting: Internal Medicine

## 2023-12-29 VITALS — BP 124/70 | HR 65 | Temp 98.0°F | Resp 18 | Ht 66.0 in | Wt 226.7 lb

## 2023-12-29 DIAGNOSIS — J3089 Other allergic rhinitis: Secondary | ICD-10-CM | POA: Diagnosis not present

## 2023-12-29 DIAGNOSIS — J302 Other seasonal allergic rhinitis: Secondary | ICD-10-CM

## 2023-12-29 DIAGNOSIS — L501 Idiopathic urticaria: Secondary | ICD-10-CM | POA: Diagnosis not present

## 2023-12-29 DIAGNOSIS — H1010 Acute atopic conjunctivitis, unspecified eye: Secondary | ICD-10-CM

## 2023-12-29 DIAGNOSIS — H1013 Acute atopic conjunctivitis, bilateral: Secondary | ICD-10-CM | POA: Diagnosis not present

## 2023-12-29 DIAGNOSIS — L219 Seborrheic dermatitis, unspecified: Secondary | ICD-10-CM | POA: Diagnosis not present

## 2023-12-29 MED ORDER — FLUOCINOLONE ACETONIDE BODY 0.01 % EX OIL: TOPICAL_OIL | CUTANEOUS | 6 refills | Status: AC

## 2023-12-29 MED ORDER — KETOCONAZOLE 2 % EX SHAM: 1.0000 | MEDICATED_SHAMPOO | CUTANEOUS | 6 refills | Status: AC

## 2023-12-29 MED ORDER — EPINEPHRINE 0.3 MG/0.3ML IJ SOAJ: 0.3000 mg | INTRAMUSCULAR | 2 refills | Status: AC | PRN

## 2023-12-29 NOTE — Patient Instructions (Addendum)
 Allergic rhinitis - improved  Continue allergen avoidance measures directed toward pollens, mold, cockroach, and dust mite as listed below Increase montelukast  to 10 mg once a day for control of allergy  symptoms Continue Allegra  180 mg once a day as needed for runny nose or itch Continue Flonase  2 sprays in each nostril once a day as needed for stuffy nose Consider saline nasal rinses as needed for nasal symptoms. Use this before any medicated nasal sprays for best result Continue  a nasal gel as needed for dry nostrils Continue allergen immunotherapy and have access to an epinephrine  autoinjector set per protocol  Allergic conjunctivitis Some over the counter eye drops include Pataday  one drop in each eye once a day as needed for red, itchy eyes OR Zaditor one drop in each eye twice a day as needed for red itchy eyes. Avoid eye drops that say red eye relief as they may contain medications that dry out your eyes.   Hives (urticaria) Use the lease amount of medications while remaining hive free Continue Allegra  180mg  (fexofenadine ) daily  Increase to Allegra  180mg  twice if itch and hives is not well controlled   Seborrheic dermatitis - not controlled  Continue ketoconazole  2% cream once a day as needed Continue ketoconazole  2% shampoo 1 application up to twice a week. Start dermasmoothe oil twice daily as needed along with antifungal treatment as above   Call the clinic if this treatment plan is not working well for you.  Follow up 12 months

## 2023-12-29 NOTE — Progress Notes (Signed)
 FOLLOW UP Date of Service/Encounter:  12/30/23  Subjective:  Brandon Suarez (DOB: 1990/11/13) is a 33 y.o. male who [facility] in re-evaluation of the following: rhinitis on ait, urticaria, seborrhea dermatitis  History obtained from: chart review and patient.  For Review, LV was on 01/13/23  with Arlean Mutter, FNP seen for routine follow-up. See below for summary of history and diagnostics.    Today presents for follow-up. Discussed the use of AI scribe software for clinical note transcription with the patient, who gave verbal consent to proceed.  History of Present Illness Brandon Suarez is a 33 year old male with seborrheic dermatitis and chronic hives who presents for management of scalp symptoms and allergy  treatment.  Seborrheic dermatitis symptoms - Ongoing symptoms primarily affecting scalp, eyebrows, and beard - Scalp is extremely dry with large flakes of skin shedding almost every other day - Uses ketoconazole  shampoo twice weekly, which provides partial relief but does not fully resolve dryness - Has tried various over-the-counter products, including oils and creams, with limited benefit  Chronic urticaria and allergic symptoms - Allergy  symptoms generally well-controlled with current regimen - Takes Allegra  once daily, which effectively controls symptoms and prevents itch or hives - Missed doses of medication result in very dry, itchy throat and development of welts - Currently receiving allergy  immunotherapy (allergy  shots), denies any SR/LR  - Occasionally uses nasal spray but not eye drops, overall medication use has decreased since starting AIT   Environmental modifications - 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 No new pertinent medical or surgical history except as noted in HPI.  ROS: All  others negative except as noted per HPI.   Objective:  BP 124/70 (BP Location: Right Arm, Patient Position: Sitting, Cuff Size: Normal)   Pulse 65   Temp 98 F (36.7 C) (Temporal)   Resp 18   Ht 5' 6 (1.676 m)   Wt 226 lb 11.2 oz (102.8 kg)   SpO2 95%   BMI 36.59 kg/m  Body mass index is 36.59 kg/m. Physical Exam: General Appearance:  Alert, cooperative, no distress, appears stated age  Head:  Normocephalic, without obvious abnormality, atraumatic  Eyes:  Conjunctiva clear, EOM's intact  Ears EACs normal bilaterally and normal TMs bilaterally  Nose: Nares normal, hypertrophic turbinates, normal mucosa, no visible anterior polyps, and septum midline  Throat: Lips, tongue normal; teeth and gums normal, normal posterior oropharynx  Neck: Supple, symmetrical  Lungs:   clear to auscultation bilaterally, Respirations unlabored, no coughing  Heart:  regular rate and rhythm and no murmur, Appears well perfused  Extremities: No edema  Skin: Mild flaking in scalp , Skin color, texture, turgor normal, and no rashes or lesions on visualized portions of skin  Neurologic: No gross deficits   Labs:  Lab Orders  No laboratory test(s) ordered today      Assessment/Plan   Patient Instructions  Allergic rhinitis - improved  Continue allergen avoidance measures directed toward pollens, mold, cockroach, and dust mite as listed below Increase montelukast  to 10 mg once a day for control of allergy  symptoms Continue Allegra  180 mg once a day as needed for runny nose or itch Continue Flonase  2 sprays in each nostril once a day as needed for stuffy nose Consider saline nasal rinses as needed for nasal symptoms. Use this before any  medicated nasal sprays for best result Continue  a nasal gel as needed for dry nostrils Continue allergen immunotherapy and have access to an epinephrine  autoinjector set per protocol  Allergic conjunctivitis Some over the counter eye drops include Pataday  one drop  in each eye once a day as needed for red, itchy eyes OR Zaditor one drop in each eye twice a day as needed for red itchy eyes. Avoid eye drops that say red eye relief as they may contain medications that dry out your eyes.   Hives (urticaria) Use the lease amount of medications while remaining hive free Continue Allegra  180mg  (fexofenadine ) daily  Increase to Allegra  180mg  twice if itch and hives is not well controlled   Seborrheic dermatitis - not controlled  Continue ketoconazole  2% cream once a day as needed Continue ketoconazole  2% shampoo 1 application up to twice a week. Start dermasmoothe oil twice daily as needed along with antifungal treatment as above   Call the clinic if this treatment plan is not working well for you.  Follow up 12 months     Other:    Thank you so much for letting me partake in your care today.  Don't hesitate to reach out if you have any additional concerns!  Hargis Springer, MD  [facility] [place], [place]

## 2024-01-11 DIAGNOSIS — J301 Allergic rhinitis due to pollen: Secondary | ICD-10-CM | POA: Diagnosis not present

## 2024-01-11 NOTE — Progress Notes (Signed)
 VIALS MADE 01-11-24

## 2024-01-12 DIAGNOSIS — J3089 Other allergic rhinitis: Secondary | ICD-10-CM | POA: Diagnosis not present

## 2024-01-12 DIAGNOSIS — J302 Other seasonal allergic rhinitis: Secondary | ICD-10-CM | POA: Diagnosis not present

## 2024-01-12 DIAGNOSIS — J3081 Allergic rhinitis due to animal (cat) (dog) hair and dander: Secondary | ICD-10-CM | POA: Diagnosis not present

## 2024-01-14 ENCOUNTER — Other Ambulatory Visit (HOSPITAL_COMMUNITY): Payer: Self-pay

## 2024-01-14 ENCOUNTER — Telehealth: Payer: Self-pay

## 2024-01-14 NOTE — Telephone Encounter (Signed)
*  AA  Pharmacy Patient Advocate Encounter   Received notification from Fax that prior authorization for Fluocinolone  Acetonide 0.01% topical oil is required/requested.   Insurance verification completed.   The patient is insured through [facility] .   Per test claim:  Fluocinolone  cream/solution, betamethasone  valerate lotion or triamcinolone cream  is preferred by the insurance.  If suggested medication is appropriate, Please send in a new RX and discontinue this one. If not, please advise as to why it's not appropriate so that we may request a Prior Authorization. Please note, some preferred medications may still require a PA.  If the suggested medications have not been trialed and there are no contraindications to their use, the PA will not be submitted, as it will not be approved.

## 2024-01-15 NOTE — Telephone Encounter (Signed)
 Spoke with Dr. Lorin and the [place]. They will fill the solution instead of the oil.

## 2024-01-21 ENCOUNTER — Ambulatory Visit (INDEPENDENT_AMBULATORY_CARE_PROVIDER_SITE_OTHER)

## 2024-01-21 ENCOUNTER — Encounter: Payer: Self-pay | Admitting: Internal Medicine

## 2024-01-21 DIAGNOSIS — J309 Allergic rhinitis, unspecified: Secondary | ICD-10-CM

## 2024-02-22 ENCOUNTER — Ambulatory Visit

## 2024-02-22 ENCOUNTER — Encounter: Payer: Self-pay | Admitting: Internal Medicine

## 2024-02-22 DIAGNOSIS — J309 Allergic rhinitis, unspecified: Secondary | ICD-10-CM | POA: Diagnosis not present

## 2024-03-25 ENCOUNTER — Encounter: Payer: Self-pay | Admitting: Internal Medicine

## 2024-03-25 ENCOUNTER — Ambulatory Visit (INDEPENDENT_AMBULATORY_CARE_PROVIDER_SITE_OTHER): Payer: Self-pay

## 2024-03-25 DIAGNOSIS — J309 Allergic rhinitis, unspecified: Secondary | ICD-10-CM

## 2024-04-13 ENCOUNTER — Encounter: Payer: Self-pay | Admitting: Internal Medicine

## 2024-04-13 ENCOUNTER — Ambulatory Visit (INDEPENDENT_AMBULATORY_CARE_PROVIDER_SITE_OTHER)

## 2024-04-13 DIAGNOSIS — J309 Allergic rhinitis, unspecified: Secondary | ICD-10-CM | POA: Diagnosis not present

## 2024-04-20 ENCOUNTER — Ambulatory Visit (INDEPENDENT_AMBULATORY_CARE_PROVIDER_SITE_OTHER)

## 2024-04-20 ENCOUNTER — Encounter: Payer: Self-pay | Admitting: Internal Medicine

## 2024-04-20 DIAGNOSIS — J309 Allergic rhinitis, unspecified: Secondary | ICD-10-CM | POA: Diagnosis not present

## 2024-05-10 ENCOUNTER — Ambulatory Visit (INDEPENDENT_AMBULATORY_CARE_PROVIDER_SITE_OTHER)

## 2024-05-10 ENCOUNTER — Encounter: Payer: Self-pay | Admitting: Internal Medicine

## 2024-05-10 DIAGNOSIS — J302 Other seasonal allergic rhinitis: Secondary | ICD-10-CM

## 2024-05-10 DIAGNOSIS — J3089 Other allergic rhinitis: Secondary | ICD-10-CM | POA: Diagnosis not present

## 2024-05-17 ENCOUNTER — Encounter: Payer: Self-pay | Admitting: Internal Medicine

## 2024-05-17 ENCOUNTER — Ambulatory Visit

## 2024-05-17 DIAGNOSIS — J302 Other seasonal allergic rhinitis: Secondary | ICD-10-CM

## 2024-06-29 ENCOUNTER — Ambulatory Visit: Admitting: Internal Medicine

## 2024-07-04 ENCOUNTER — Ambulatory Visit: Admitting: Internal Medicine
